# Patient Record
Sex: Male | Born: 1970 | Race: Black or African American | Hispanic: No | Marital: Married | Smoking: Never smoker
Health system: Southern US, Community
[De-identification: ages and names within clinical notes are randomized; demographics above are authoritative.]

## PROBLEM LIST (undated history)

## (undated) DIAGNOSIS — J3800 Paralysis of vocal cords and larynx, unspecified: Secondary | ICD-10-CM

## (undated) DIAGNOSIS — G44009 Cluster headache syndrome, unspecified, not intractable: Secondary | ICD-10-CM

## (undated) DIAGNOSIS — Z945 Skin transplant status: Secondary | ICD-10-CM

## (undated) DIAGNOSIS — S060X9A Concussion with loss of consciousness of unspecified duration, initial encounter: Secondary | ICD-10-CM

## (undated) DIAGNOSIS — G43909 Migraine, unspecified, not intractable, without status migrainosus: Secondary | ICD-10-CM

## (undated) DIAGNOSIS — Z902 Acquired absence of lung [part of]: Secondary | ICD-10-CM

## (undated) HISTORY — PX: LARYNX SURGERY: SHX692

## (undated) HISTORY — PX: OTHER SURGICAL HISTORY: SHX169

## (undated) HISTORY — DX: Cluster headache syndrome, unspecified, not intractable: G44.009

## (undated) HISTORY — DX: Concussion with loss of consciousness of unspecified duration, initial encounter: S06.0X9A

## (undated) HISTORY — DX: Acquired absence of lung (part of): Z90.2

---

## 1993-07-16 DIAGNOSIS — J3800 Paralysis of vocal cords and larynx, unspecified: Secondary | ICD-10-CM

## 1993-07-16 HISTORY — DX: Paralysis of vocal cords and larynx, unspecified: J38.00

## 2006-04-17 ENCOUNTER — Ambulatory Visit: Payer: Self-pay | Admitting: Internal Medicine

## 2010-06-01 ENCOUNTER — Encounter
Admission: RE | Admit: 2010-06-01 | Discharge: 2010-06-01 | Payer: Self-pay | Source: Home / Self Care | Admitting: Family Medicine
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
## 2013-05-26 ENCOUNTER — Ambulatory Visit
Admission: RE | Admit: 2013-05-26 | Discharge: 2013-05-26 | Disposition: A | Discharge status: Home / Self Care | Payer: 59 | Source: Ambulatory Visit | Attending: Family Medicine | Admitting: Family Medicine

## 2013-05-26 ENCOUNTER — Other Ambulatory Visit: Payer: Self-pay | Admitting: Family Medicine

## 2013-05-26 DIAGNOSIS — G8929 Other chronic pain: Secondary | ICD-10-CM

## 2013-09-16 ENCOUNTER — Emergency Department (HOSPITAL_COMMUNITY)
Admission: EM | Admit: 2013-09-16 | Discharge: 2013-09-17 | Disposition: A | Discharge status: Home / Self Care | Payer: Managed Care, Other (non HMO) | Attending: Emergency Medicine | Admitting: Emergency Medicine

## 2013-09-16 DIAGNOSIS — Z8709 Personal history of other diseases of the respiratory system: Secondary | ICD-10-CM | POA: Insufficient documentation

## 2013-09-16 DIAGNOSIS — G44009 Cluster headache syndrome, unspecified, not intractable: Secondary | ICD-10-CM

## 2013-09-16 HISTORY — DX: Skin transplant status: Z94.5

## 2013-09-16 HISTORY — DX: Paralysis of vocal cords and larynx, unspecified: J38.00

## 2013-09-17 ENCOUNTER — Encounter (HOSPITAL_COMMUNITY): Payer: Self-pay | Admitting: Emergency Medicine

## 2013-09-17 MED ORDER — DIPHENHYDRAMINE HCL 50 MG/ML IJ SOLN: 12.5000 mg | Freq: Once | INTRAMUSCULAR | Status: DC

## 2013-09-17 MED ORDER — DEXAMETHASONE SODIUM PHOSPHATE 10 MG/ML IJ SOLN: 10.0000 mg | Freq: Once | INTRAMUSCULAR | Status: DC

## 2013-09-17 MED ORDER — METOCLOPRAMIDE HCL 10 MG PO TABS: 10.0000 mg | ORAL_TABLET | Freq: Once | ORAL | Status: DC

## 2013-09-17 NOTE — Discharge Instructions (Signed)
Cluster Headache  Cluster headaches are recognized by their pattern of deep, intense head pain. They normally occur on one side of your head, but they may "switch sides" in subsequent episodes. Typically, cluster headaches:   · Are severe in nature.    · Occur repeatedly over weeks to months and are followed by periods of no headaches.    · Can last from 15 minutes to 3 hours.    · Occur at the same time each day, often at night.    · Occur several times a day.  CAUSES  The exact cause of cluster headaches is not known. Alcohol use may be associated with cluster headaches.  SIGNS AND SYMPTOMS   · Severe pain that begins in or around your eye or temple.    · One-sided head pain.    · Feeling sick to your stomach (nauseous).    · Sensitivity to light.    · Runny nose.    · Eye redness, tearing, and nasal stuffiness on the side of your head where you are experiencing pain.    · Sweaty, pale skin of the face.    · Droopy or swollen eyelid.    · Restlessness.  DIAGNOSIS   Cluster headaches are diagnosed based on symptoms and a physical exam. Your health care provider may order a CT scan or an MRI of your head or lab tests to see if your headaches are caused by other medical conditions.   TREATMENT   · Medicines for pain relief and to prevent recurrent attacks. Some people may need a combination of medicines.  · Oxygen for pain relief.    · Biofeedback programs to help reduce headache pain.    It may be helpful to keep a headache diary. This may help you find a trend for what is triggering your headaches. Your health care provider can develop a treatment plan.   HOME CARE INSTRUCTIONS   During cluster periods:   · Follow a regular sleep schedule. Do not vary the amount and time that you sleep from day to day. It is important to stay on the same schedule during a cluster period to help prevent headaches.    · Avoid alcohol.    · Stop smoking if you smoke.    SEEK MEDICAL CARE IF:  · You have any changes from your previous  cluster headaches either in intensity or frequency.    · You are not getting relief from medicines you are taking.    SEEK IMMEDIATE MEDICAL CARE IF:   · You faint.    · You have weakness or numbness, especially on one side of your body or face.    · You have double vision.    · You have nausea or vomiting that is not relieved within several hours.    · You cannot keep your balance or have difficulty talking or walking.    · You have neck pain or stiffness.    · You have a fever.  MAKE SURE YOU:  · Understand these instructions.    · Will watch your condition.    · Will get help right away if you are not doing well or get worse.  Document Released: 07/02/2005 Document Revised: 04/22/2013 Document Reviewed: 01/22/2013  ExitCare® Patient Information ©2014 [employer].

## 2013-09-17 NOTE — ED Notes (Signed)
Instructed by Nehemiah Settle, PA to hold meds at this time to see if O2 continues to help with pain.

## 2013-09-17 NOTE — ED Provider Notes (Signed)
CSN: [id number]     Arrival date & time 09/16/13  2246 History   First MD Initiated Contact with Patient 09/17/13 0200     Chief Complaint  Patient presents with  . Headache     (Consider location/radiation/quality/duration/timing/severity/associated sxs/prior Treatment) Patient is a 43 y.o. male presenting with headaches. The history is provided by the patient. No language interpreter was used.  Headache Pain location:  L parietal and L temporal Radiates to:  Face Associated symptoms: no fever   Associated symptoms comment:  He describes the headache as sharp, shooting pain from the left parietal and temporal scalp extending into left face. It is sudden and sharp, then lingers with significant but less intense pain for up to 3 hours at a time. He states it has kept him up for the past week. No nausea, fever, visual changes.    Past Medical History  Diagnosis Date  . Paralyzed vocal cords   . H/O skin graft    Past Surgical History  Procedure Laterality Date  . Total lung removal Left     1995   No family history on file. History  Substance Use Topics  . Smoking status: Not on file  . Smokeless tobacco: Not on file  . Alcohol Use: Not on file    Review of Systems  Constitutional: Negative for fever and chills.  Respiratory: Negative.   Cardiovascular: Negative.   Gastrointestinal: Negative.   Musculoskeletal: Negative.   Skin: Negative.   Neurological: Positive for headaches.      Allergies  Review of patient's allergies indicates no known allergies.  Home Medications   Current Outpatient Rx  Name  Route  Sig  Dispense  Refill  . Aspirin-Caffeine 845-65 MG PACK   Oral   Take 1 Package by mouth every 6 (six) hours as needed (pain).         Marland Kitchen HYDROcodone-acetaminophen (NORCO/VICODIN) 5-325 MG per tablet   Oral   Take 1 tablet by mouth every 6 (six) hours as needed for severe pain.          Marland Kitchen ibuprofen (ADVIL,MOTRIN) 200 MG tablet   Oral   Take 600 mg  by mouth every 6 (six) hours as needed.         . naproxen sodium (ANAPROX) 220 MG tablet   Oral   Take 440 mg by mouth 2 (two) times daily as needed.          BP 124/70  Pulse 62  Temp(Src) 97.8 F (36.6 C) (Oral)  Resp 20  Ht 5\' 4"  (1.626 m)  Wt 172 lb (78.019 kg)  BMI 29.51 kg/m2  SpO2 98% Physical Exam  Constitutional: He is oriented to person, place, and time. He appears well-developed and well-nourished.  HENT:  Head: Normocephalic and atraumatic.  Eyes: EOM are normal. Pupils are equal, round, and reactive to light.  Neck: Normal range of motion.  Cardiovascular: Normal rate and regular rhythm.   No murmur heard. Pulmonary/Chest: Effort normal and breath sounds normal. He has no wheezes. He has no rales.  Abdominal: Soft. There is no tenderness.  Neurological: He is alert and oriented to person, place, and time. He has normal strength and normal reflexes. No sensory deficit. He displays a negative Romberg sign.    ED Course  Procedures (including critical care time) Labs Review Labs Reviewed - No data to display Imaging Review No results found.   EKG Interpretation None      MDM   Final diagnoses:  None    1. Cluster headache  Patient's pain is 100% better with high flow oxygen supporting diagnosis of cluster type headache. He remains pain free on re-examination. Stable for discharge.     Dewaine Oats, PA-C 09/17/13 [phone]

## 2013-09-17 NOTE — ED Notes (Signed)
Pt states he has had a HA since Sunday and thought it was a tooth but went to dentist and ruled that out. Mostly L side. Pressure. Light sensitivity. Had headaches when he was younger but has started having them again in the past 6 months.

## 2013-09-17 NOTE — ED Notes (Signed)
Pt placed on NRB 10L to aid in helping with headache if it is indead a cluster headache per Murphy PA.

## 2013-09-17 NOTE — ED Provider Notes (Signed)
Medical screening examination/treatment/procedure(s) were performed by non-physician practitioner and as supervising physician I was immediately available for consultation/collaboration.    Shanley Furlough, MD 09/17/13 0604 

## 2013-09-19 ENCOUNTER — Encounter (HOSPITAL_COMMUNITY): Payer: Self-pay | Admitting: Emergency Medicine

## 2013-09-19 ENCOUNTER — Emergency Department (HOSPITAL_COMMUNITY)
Admission: EM | Admit: 2013-09-19 | Discharge: 2013-09-19 | Disposition: A | Discharge status: Home / Self Care | Payer: Managed Care, Other (non HMO) | Attending: Emergency Medicine | Admitting: Emergency Medicine

## 2013-09-19 DIAGNOSIS — H53149 Visual discomfort, unspecified: Secondary | ICD-10-CM | POA: Insufficient documentation

## 2013-09-19 DIAGNOSIS — R51 Headache: Secondary | ICD-10-CM | POA: Insufficient documentation

## 2013-09-19 DIAGNOSIS — Z8679 Personal history of other diseases of the circulatory system: Secondary | ICD-10-CM | POA: Insufficient documentation

## 2013-09-19 DIAGNOSIS — R519 Headache, unspecified: Secondary | ICD-10-CM

## 2013-09-19 DIAGNOSIS — Z902 Acquired absence of lung [part of]: Secondary | ICD-10-CM | POA: Insufficient documentation

## 2013-09-19 DIAGNOSIS — H538 Other visual disturbances: Secondary | ICD-10-CM | POA: Insufficient documentation

## 2013-09-19 DIAGNOSIS — H93239 Hyperacusis, unspecified ear: Secondary | ICD-10-CM | POA: Insufficient documentation

## 2013-09-19 HISTORY — DX: Migraine, unspecified, not intractable, without status migrainosus: G43.909

## 2013-09-19 MED ORDER — KETOROLAC TROMETHAMINE 60 MG/2ML IM SOLN
60.0000 mg | Freq: Once | INTRAMUSCULAR | Status: AC
  Administered 2013-09-19: 60 mg via INTRAMUSCULAR
  Filled 2013-09-19: qty 2

## 2013-09-19 MED ORDER — BUTALBITAL-APAP-CAFFEINE 50-325-40 MG PO TABS: 1.0000 | ORAL_TABLET | Freq: Four times a day (QID) | ORAL | Status: DC | PRN

## 2013-09-19 MED ORDER — PROCHLORPERAZINE EDISYLATE 5 MG/ML IJ SOLN
10.0000 mg | Freq: Once | INTRAMUSCULAR | Status: AC
  Administered 2013-09-19: 10 mg via INTRAMUSCULAR
  Filled 2013-09-19: qty 2

## 2013-09-19 NOTE — ED Provider Notes (Signed)
CSN: [id number]     Arrival date & time 09/19/13  1453 History  This chart was scribed for non-physician practitioner working with [place], DO by Stacy Gardner, ED scribe. This patient was seen in room [name] and the patient's care was started at 3:34 PM.   First MD Initiated Contact with Patient 09/19/13 1510     Chief Complaint  Patient presents with  . Headache     (Consider location/radiation/quality/duration/timing/severity/associated sxs/prior Treatment) Patient is a 43 y.o. male presenting with headaches. The history is provided by the patient and medical records. No language interpreter was used.  Headache Associated symptoms: photophobia   Associated symptoms: no nausea and no vomiting    HPI Comments: Richard Hughes is a 43 y.o. male who presents to the Emergency Department complaining of a constant, severe, headache with onset of 3 days ago and has been worse for the past four hours.  pain localized to left frontal and temporal regions with associated photophobia, phonophobia, and mildly blurred vision in his left eye which is common during his headaches. Denies dizziness, lightheadedness, confusion, changes in speech, tinnitus, or neck pain.  No fevers or chills.  He was seen at the ED three days ago for the same symptoms and was treated for clustered headaches.  Pt has a hx of migraines. Pt states migraines started with he was a teenager but is not currently on any maintenance medications.   No intervention tried today PTA.  Past Medical History  Diagnosis Date  . Paralyzed vocal cords   . H/O skin graft   . Migraine    Past Surgical History  Procedure Laterality Date  . Total lung removal Left     1995   No family history on file. History  Substance Use Topics  . Smoking status: Never Smoker   . Smokeless tobacco: Never Used  . Alcohol Use: Yes     Comment: Socailly     Review of Systems  Eyes: Positive for photophobia.  Gastrointestinal: Negative  for nausea and vomiting.  Neurological: Positive for headaches.  Psychiatric/Behavioral: Negative for confusion.  All other systems reviewed and are negative.      Allergies  Review of patient's allergies indicates no known allergies.  Home Medications   Current Outpatient Rx  Name  Route  Sig  Dispense  Refill  . Aspirin-Caffeine 845-65 MG PACK   Oral   Take 1 Package by mouth every 6 (six) hours as needed (pain).         Marland Kitchen HYDROcodone-acetaminophen (NORCO/VICODIN) 5-325 MG per tablet   Oral   Take 1 tablet by mouth every 6 (six) hours as needed for severe pain.          Marland Kitchen ibuprofen (ADVIL,MOTRIN) 200 MG tablet   Oral   Take 600 mg by mouth every 6 (six) hours as needed.         . naproxen sodium (ANAPROX) 220 MG tablet   Oral   Take 440 mg by mouth 2 (two) times daily as needed.          BP 142/82  Pulse 62  Temp(Src) 98.2 F (36.8 C) (Oral)  Resp 18  SpO2 98%  Physical Exam  Nursing note and vitals reviewed. Constitutional: He is oriented to person, place, and time. He appears well-developed and well-nourished. No distress.  HENT:  Head: Normocephalic and atraumatic.  Mouth/Throat: Oropharynx is clear and moist.  Eyes: Conjunctivae and EOM are normal. Pupils are equal, round, and reactive  to light.  Neck: Normal range of motion and full passive range of motion without pain. Neck supple. No rigidity.  Neck supple, no meningeal signs  Cardiovascular: Normal rate, regular rhythm and normal heart sounds.   Pulmonary/Chest: Effort normal and breath sounds normal.  Musculoskeletal: Normal range of motion.  Neurological: He is alert and oriented to person, place, and time. He has normal strength. He displays no tremor. No cranial nerve deficit or sensory deficit. He displays no seizure activity.  AAOx3, answering questions appropriately; equal strength UE and LE bilaterally; CN grossly intact; moves all extremities appropriately without ataxia; no focal neuro  deficits or facial asymmetry appreciated  Skin: Skin is warm and dry. He is not diaphoretic.  Psychiatric: He has a normal mood and affect.    ED Course  Procedures (including critical care time) DIAGNOSTIC STUDIES: Oxygen Saturation is 98% on room air, normal by my interpretation.    COORDINATION OF CARE:  3:37 PM Discussed course of care with pt . Pt understands and agrees.   Labs Review Labs Reviewed - No data to display Imaging Review No results found.   EKG Interpretation None      MDM   Final diagnoses:  Headache   Patient with typical migraine headache. No focal neuro deficits noted on physical exam. No fever or nuchal rigidity. At this time I have low suspicion for TIA, stroke, SAH, ICH, or meningitis.  Pt give migraine cocktail and will reassess.  Headache completely resolved after meds.  Pt states he feels better and is ready to go home.  Neuro exam remains intact.  Rx fioricet, only if no relief from excedrin migraine/tension headache.  Discussed plan with pt, they agreed.  Return precautions advised.  I personally performed the services described in this documentation, which was scribed in my presence. The recorded information has been reviewed and is accurate.  [name], PA-C 09/19/13 1835

## 2013-09-19 NOTE — ED Notes (Signed)
Pt reports having a headache that started at 11:00. Pt was seen here on 09/17/13 for the same. Pt has history of migraines. Pt endorses light and sound sensitivity. Pt denies nausea or emesis. Pt is A/O x4, vitals are WDL, and in NAD.

## 2013-09-19 NOTE — ED Provider Notes (Signed)
Medical screening examination/treatment/procedure(s) were performed by non-physician practitioner and as supervising physician I was immediately available for consultation/collaboration.   EKG Interpretation None        Richard Hughes N Jestine Bicknell, DO 09/19/13 2256 

## 2013-09-19 NOTE — Discharge Instructions (Signed)
Advised taking Excedrin Migraine or Excedrin tension headache.  If neither of these work, may take prescribed medication, only for SEVERE headache. Return to the ED for new or worsening symptoms.

## 2013-09-21 ENCOUNTER — Encounter: Payer: Self-pay | Admitting: Neurology

## 2013-09-21 ENCOUNTER — Telehealth: Payer: Self-pay | Admitting: Neurology

## 2013-09-21 ENCOUNTER — Ambulatory Visit (INDEPENDENT_AMBULATORY_CARE_PROVIDER_SITE_OTHER): Payer: Managed Care, Other (non HMO) | Admitting: Neurology

## 2013-09-21 VITALS — BP 127/55 | HR 65 | Ht 66.75 in | Wt 180.0 lb

## 2013-09-21 DIAGNOSIS — R51 Headache: Secondary | ICD-10-CM

## 2013-09-21 DIAGNOSIS — G44009 Cluster headache syndrome, unspecified, not intractable: Secondary | ICD-10-CM | POA: Insufficient documentation

## 2013-09-21 MED ORDER — METHYLPREDNISOLONE (PAK) 4 MG PO TABS: ORAL_TABLET | ORAL | Status: DC
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
## 2013-09-21 NOTE — Telephone Encounter (Signed)
Message sent to Dr. Janann Colonel, [facility] is needed to know what liter flow the patient should be on.  Please advise.  Thanks.

## 2013-09-21 NOTE — Telephone Encounter (Signed)
Please let her know it should be 88minutes at 12liters. Thanks.

## 2013-09-21 NOTE — Patient Instructions (Signed)
Overall you are doing fairly well but I do want to suggest a few things today:   Remember to drink plenty of fluid, eat healthy meals and do not skip any meals. Try to eat protein with a every meal and eat a healthy snack such as fruit or nuts in between meals. Try to keep a regular sleep-wake schedule and try to exercise daily, particularly in the form of walking, 20-30 minutes a day, if you can.   As far as your medications are concerned, I would like to suggest the following: 1)Please try using the steroid taper as instructed 2)We will place a request for oxygen therapy to help treat your headaches  As far as diagnostic testing:  1)I would like you to have a MRA of your brain. You will be called to schedule this  I will be in contact once the MRA is completed. Please call us with any interim questions, concerns, problems, updates or refill requests.   Please also call us for any test results so we can go over those with you on the phone.  My clinical assistant and will answer any of your questions and relay your messages to me and also relay most of my messages to you.   Our phone number is [phone]. We also have an after hours call service for urgent matters and there is a physician on-call for urgent questions. For any emergencies you know to call 911 or go to the nearest emergency room

## 2013-09-21 NOTE — Telephone Encounter (Signed)
[facility] was called and relayed the information but they stated that they need a written rx faxed over.  Please advise.

## 2013-09-21 NOTE — Telephone Encounter (Signed)
Prescription faxed over. Thanks

## 2013-09-21 NOTE — Telephone Encounter (Signed)
Grover from [facility] calling to state that they received order for oxygen for patient's pressure headaches but they need it to be written so that the liter flow is included in the order. Please call her back and advise.

## 2013-09-22 ENCOUNTER — Telehealth: Payer: Self-pay | Admitting: Neurology

## 2013-09-22 NOTE — Telephone Encounter (Signed)
Spoke with patient about his appointment that is scheduled for 09/24/13 @ 11:30 with Dr. Janann Colonel, patient received a call that he needed to come into the office for an appointment, the appointment is supposed to be with [facility], appointment with Janann Colonel has been cancelled. Patient also expressed some concerns with using the portable oxygen tank, Liane Comber is discussing the issue with him.

## 2013-09-22 NOTE — Telephone Encounter (Signed)
I called pt and he is to call [facility] to see about how to use 02 tank.  If he still has questions to call me and I'll be glad to see him when he is here for MRI.  (12 liters / 13min) prn cluster headache.

## 2013-09-24 ENCOUNTER — Other Ambulatory Visit: Payer: Self-pay | Admitting: Neurology

## 2013-09-24 ENCOUNTER — Encounter: Payer: Self-pay | Admitting: Neurology

## 2013-09-24 ENCOUNTER — Telehealth: Payer: Self-pay | Admitting: *Deleted

## 2013-09-24 ENCOUNTER — Ambulatory Visit: Payer: Managed Care, Other (non HMO) | Admitting: Neurology

## 2013-09-24 ENCOUNTER — Ambulatory Visit (INDEPENDENT_AMBULATORY_CARE_PROVIDER_SITE_OTHER): Payer: Managed Care, Other (non HMO)

## 2013-09-24 DIAGNOSIS — R51 Headache: Secondary | ICD-10-CM

## 2013-09-24 DIAGNOSIS — G44009 Cluster headache syndrome, unspecified, not intractable: Secondary | ICD-10-CM

## 2013-09-24 MED ORDER — VERAPAMIL HCL ER 240 MG PO CP24: 240.0000 mg | ORAL_CAPSULE | Freq: Every day | ORAL | Status: DC

## 2013-09-24 NOTE — Telephone Encounter (Signed)
Spoke with patient and he said the reason for the extension is that headaches are not better, medication /oxygen is not working, would like to try something else. He will need to  pick up letter before he goes to work on Sunday night.

## 2013-09-25 MED ORDER — GADOPENTETATE DIMEGLUMINE 469.01 MG/ML IV SOLN: 19.0000 mL | Freq: Once | INTRAVENOUS | Status: AC | PRN

## 2013-09-25 NOTE — Telephone Encounter (Signed)
Pt called to check on the status of this request.  Please call advise.  Thank you

## 2013-09-25 NOTE — Telephone Encounter (Signed)
Called patient to inform him that his letter that he requested was ready to be picked up at the front desk and if he has any other problems, questions or concerns to call the office. Patient verbalized understanding.

## 2013-09-28 NOTE — Progress Notes (Signed)
Quick Note:  Shared normal MR Brain results with patient per Dr Hazle Quant findings, he verbalized understanding ______

## 2014-02-18 NOTE — Telephone Encounter (Signed)
Noted  

## 2018-01-13 DIAGNOSIS — S060XAA Concussion with loss of consciousness status unknown, initial encounter: Secondary | ICD-10-CM

## 2018-01-13 DIAGNOSIS — S060X9A Concussion with loss of consciousness of unspecified duration, initial encounter: Secondary | ICD-10-CM

## 2018-01-13 HISTORY — DX: Concussion with loss of consciousness status unknown, initial encounter: S06.0XAA

## 2018-01-13 HISTORY — DX: Concussion with loss of consciousness of unspecified duration, initial encounter: S06.0X9A

## 2018-01-21 ENCOUNTER — Emergency Department (HOSPITAL_COMMUNITY): Payer: 59

## 2018-01-21 ENCOUNTER — Encounter (HOSPITAL_COMMUNITY): Payer: Self-pay | Admitting: Emergency Medicine

## 2018-01-21 ENCOUNTER — Other Ambulatory Visit: Payer: Self-pay

## 2018-01-21 ENCOUNTER — Emergency Department (HOSPITAL_COMMUNITY)
Admission: EM | Admit: 2018-01-21 | Discharge: 2018-01-21 | Disposition: A | Discharge status: Home / Self Care | Payer: 59 | Attending: Emergency Medicine | Admitting: Emergency Medicine

## 2018-01-21 DIAGNOSIS — R519 Headache, unspecified: Secondary | ICD-10-CM

## 2018-01-21 DIAGNOSIS — R55 Syncope and collapse: Secondary | ICD-10-CM | POA: Insufficient documentation

## 2018-01-21 DIAGNOSIS — R51 Headache: Secondary | ICD-10-CM | POA: Insufficient documentation

## 2018-01-21 LAB — BASIC METABOLIC PANEL
Anion gap: 11 (ref 5–15)
BUN: 16 mg/dL (ref 6–20)
CHLORIDE: 107 mmol/L (ref 98–111)
CO2: 27 mmol/L (ref 22–32)
CREATININE: 1.08 mg/dL (ref 0.61–1.24)
Calcium: 9.8 mg/dL (ref 8.9–10.3)
GFR calc Af Amer: >60 mL/min (ref >60)
GFR calc non Af Amer: >60 mL/min (ref >60)
Glucose, Bld: 99 mg/dL (ref 70–99)
Potassium: 4 mmol/L (ref 3.5–5.1)
Sodium: 145 mmol/L (ref 135–145)

## 2018-01-21 LAB — CBC
HCT: 44.2 % (ref 39.0–52.0)
Hemoglobin: 15.8 g/dL (ref 13.0–17.0)
MCH: 32.5 pg (ref 26.0–34.0)
MCHC: 35.7 g/dL (ref 30.0–36.0)
MCV: 90.9 fL (ref 78.0–100.0)
Platelets: 289 10*3/uL (ref 150–400)
RBC: 4.86 MIL/uL (ref 4.22–5.81)
RDW: 11.9 % (ref 11.5–15.5)
WBC: 6.1 10*3/uL (ref 4.0–10.5)

## 2018-01-21 LAB — URINALYSIS, ROUTINE W REFLEX MICROSCOPIC
Bilirubin Urine: NEGATIVE
GLUCOSE, UA: NEGATIVE mg/dL
HGB URINE DIPSTICK: NEGATIVE
Ketones, ur: NEGATIVE mg/dL
Leukocytes, UA: NEGATIVE
Nitrite: NEGATIVE
Protein, ur: NEGATIVE mg/dL
SPECIFIC GRAVITY, URINE: 1.023 (ref 1.005–1.030)
pH: 5 (ref 5.0–8.0)

## 2018-01-21 LAB — CBG MONITORING, ED: Glucose-Capillary: 92 mg/dL (ref 70–99)

## 2018-01-21 MED ORDER — KETOROLAC TROMETHAMINE 30 MG/ML IJ SOLN
30.0000 mg | Freq: Once | INTRAMUSCULAR | Status: AC
  Administered 2018-01-21: 30 mg via INTRAMUSCULAR
  Filled 2018-01-21: qty 1

## 2018-01-21 MED ORDER — IBUPROFEN 600 MG PO TABS: 600.0000 mg | ORAL_TABLET | Freq: Four times a day (QID) | ORAL | 0 refills | Status: DC | PRN

## 2018-01-21 MED ORDER — IOPAMIDOL (ISOVUE-370) INJECTION 76%
INTRAVENOUS | Status: AC
  Filled 2018-01-21: qty 100

## 2018-01-21 MED ORDER — IOPAMIDOL (ISOVUE-370) INJECTION 76%
100.0000 mL | Freq: Once | INTRAVENOUS | Status: AC | PRN
  Administered 2018-01-21: 100 mL via INTRAVENOUS

## 2018-01-21 MED ORDER — ACETAMINOPHEN 500 MG PO TABS: 500.0000 mg | ORAL_TABLET | Freq: Four times a day (QID) | ORAL | 0 refills | Status: DC | PRN

## 2018-01-21 MED ORDER — DEXAMETHASONE SODIUM PHOSPHATE 10 MG/ML IJ SOLN
10.0000 mg | Freq: Once | INTRAMUSCULAR | Status: AC
  Administered 2018-01-21: 10 mg via INTRAMUSCULAR
  Filled 2018-01-21: qty 1

## 2018-01-21 NOTE — ED Provider Notes (Signed)
[facility] DEPT Provider Note   CSN: [id number] Arrival date & time: 01/21/18  1623     History   Chief Complaint Chief Complaint  Patient presents with  . Headache  . Loss of Consciousness    HPI Richard Hughes is a 47 y.o. male with history of cluster headaches, migraines, and paralyzed vocal cords presents for evaluation of acute onset, waxing and waning left-sided headache for 3 days.  He states that on Sunday 3 days ago at around 11 AM he got up from a seated position and had a syncopal episode.  Patient's wife states that he had lost consciousness for approximately 1 minute before regaining consciousness.  She states that he fell backwards and landed on the back of his head.  She states that he was somewhat confused when he regained consciousness and was complaining of a left-sided headache at that time.  The patient was able to ambulate back to his seat and was at his mental status baseline within a few minutes.  There is no seizure-like activity, urinary incontinence, or tongue injury per the wife.  Patient denies any prodrome leading up to the syncopal episode but states that he was a little bit short of breath from laughing prior to the syncopal episode.  Since the episode, he has had a waxing and waning left-sided headache which radiates to the left side of the neck.  Headache worsens with position changes primarily with standing.  It is at times a dull pressure, sometimes sharp and stabbing.  States his headache is less severe than cluster headaches and migraines he has had in the past but has been more persistent.  He does note blurry vision and lightheadedness when he changes positions but denies any further syncopal episodes.  He does feel as though he has been having difficulty remembering things over the past few days and sometimes feels as though he is unsteady when he ambulates.  He has had some intermittent left foot and fingertip numbness since the  syncopal episode.  He denies back pain, chest pain, shortness of breath, abdominal pain, nausea, or vomiting.  He has tried ibuprofen without any significant relief of his symptoms.  The history is provided by the patient.    Past Medical History:  Diagnosis Date  . H/O skin graft   . Migraine   . Paralyzed vocal cords     Patient Active Problem List   Diagnosis Date Noted  . Headaches, cluster 09/21/2013    Past Surgical History:  Procedure Laterality Date  . total lung removal Left    1995        Home Medications    Prior to Admission medications   Medication Sig Start Date End Date Taking? Authorizing Provider  acetaminophen (TYLENOL) 500 MG tablet Take 1 tablet (500 mg total) by mouth every 6 (six) hours as needed. 01/21/18   Aloma Boch A, PA-C  ibuprofen (ADVIL,MOTRIN) 600 MG tablet Take 1 tablet (600 mg total) by mouth every 6 (six) hours as needed. 01/21/18   Nils Flack, Azarah Dacy A, PA-C  methylPREDNIsolone (MEDROL DOSPACK) 4 MG tablet follow package directions Patient not taking: Reported on 01/21/2018 09/21/13   Drema Dallas, DO  verapamil (VERELAN PM) 240 MG 24 hr capsule Take 1 capsule (240 mg total) by mouth at bedtime. Patient not taking: Reported on 01/21/2018 09/24/13   Drema Dallas, DO    Family History No family history on file.  Social History Social History   Tobacco  Use  . Smoking status: Never Smoker  . Smokeless tobacco: Never Used  Substance Use Topics  . Alcohol use: Yes    Comment: Socailly   . Drug use: No     Allergies   Fentanyl   Review of Systems Review of Systems  Constitutional: Negative for chills and fever.  Eyes: Positive for visual disturbance. Negative for photophobia.  Respiratory: Negative for shortness of breath.   Cardiovascular: Negative for chest pain.  Gastrointestinal: Negative for abdominal pain, nausea and vomiting.  Musculoskeletal: Positive for neck pain. Negative for back pain.  Neurological: Positive for syncope,  light-headedness, numbness (intermittent) and headaches. Negative for seizures and weakness.  All other systems reviewed and are negative.    Physical Exam Updated Vital Signs BP 132/90   Pulse (!) 58   Temp 97.9 F (36.6 C) (Oral)   Resp 16   Ht 5\' 5"  (1.651 m)   Wt 78.5 kg (173 lb)   SpO2 99%   BMI 28.79 kg/m   Physical Exam  Constitutional: He is oriented to person, place, and time. He appears well-developed and well-nourished. No distress.  HENT:  Head: Normocephalic and atraumatic.  No Battle's signs, no raccoon's eyes, no rhinorrhea. No hemotympanum. No tenderness to palpation of the face or skull. No deformity, crepitus, or swelling noted.   Eyes: Pupils are equal, round, and reactive to light. Conjunctivae and EOM are normal. Right eye exhibits no discharge. Left eye exhibits no discharge. Right eye exhibits normal extraocular motion and no nystagmus. Left eye exhibits normal extraocular motion and no nystagmus.  Neck: Normal range of motion. Neck supple. No JVD present. No neck rigidity. No tracheal deviation present. No Brudzinski's sign and no Kernig's sign noted.  No midline spine TTP, no paraspinal muscle tenderness, no deformity, crepitus, or step-off noted   Cardiovascular: Normal rate, regular rhythm, normal heart sounds and intact distal pulses.  Pulmonary/Chest: Effort normal and breath sounds normal.  Abdominal: Soft. Bowel sounds are normal. He exhibits no distension.  Musculoskeletal: Normal range of motion. He exhibits no edema.  5/5 strength of BUE and BLE major muscle groups.  Moving extremities spontaneously, no deformity, crepitus, ecchymosis, or swelling noted on palpation of the extremities. No midline spine TTP, no paraspinal muscle tenderness, no deformity, crepitus, or step-off noted   Neurological: He is alert and oriented to person, place, and time. He has normal strength. He is not disoriented. No cranial nerve deficit or sensory deficit. He  displays a negative Romberg sign. GCS eye subscore is 4. GCS verbal subscore is 5. GCS motor subscore is 6.  Mental Status:  Alert, thought content appropriate, able to give a coherent history. Speech fluent without evidence of aphasia. Able to follow 2 step commands without difficulty.  Cranial Nerves:  II:  Peripheral visual fields grossly normal, pupils equal, round, reactive to light III,IV, VI: ptosis not present, extra-ocular motions intact bilaterally  V,VII: smile symmetric, facial light touch sensation equal VIII: hearing grossly normal to voice  X: uvula elevates symmetrically  XI: bilateral shoulder shrug symmetric and strong XII: midline tongue extension without fassiculations Motor:  Normal tone. 5/5 strength of BUE and BLE major muscle groups including strong and equal grip strength and dorsiflexion/plantar flexion Sensory: light touch normal in all extremities. Cerebellar: normal finger-to-nose with bilateral upper extremities Gait: normal gait and balance. Able to walk on toes and heels with ease.     Skin: Skin is warm and dry. No erythema.  Psychiatric: He has a normal  mood and affect. His behavior is normal.  Nursing note and vitals reviewed.    ED Treatments / Results  Labs (all labs ordered are listed, but only abnormal results are displayed) Labs Reviewed  BASIC METABOLIC PANEL  CBC  URINALYSIS, ROUTINE W REFLEX MICROSCOPIC  CBG MONITORING, ED    EKG EKG Interpretation  Date/Time:  Tuesday January 21 2018 16:32:46 EDT Ventricular Rate:  70 PR Interval:    QRS Duration: 99 QT Interval:  371 QTC Calculation: 401 R Axis:   57 Text Interpretation:  Sinus rhythm Borderline T wave abnormalities No old tracing to compare Confirmed by Daleen Bo [phone]) on 01/21/2018 8:40:23 PM   Radiology Ct Angio Head W Or Wo Contrast  Result Date: 01/21/2018 CLINICAL DATA:  Witnessed syncopal episode three days ago, struck head on floor. Subsequent LEFT neck pain and  headache. History of migraines, vocal cord paralysis, LEFT pneumonectomy. EXAM: CT ANGIOGRAPHY HEAD TECHNIQUE: Multidetector CT imaging of the head was performed using the standard protocol during bolus administration of intravenous contrast. Multiplanar CT image reconstructions and MIPs were obtained to evaluate the vascular anatomy. CONTRAST:  180mL ISOVUE-370 IOPAMIDOL (ISOVUE-370) INJECTION 76% COMPARISON:  CT HEAD and CT cervical spine January 21, 2018. FINDINGS: ANTERIOR CIRCULATION: Patent cervical internal carotid arteries, petrous, cavernous and supra clinoid internal carotid arteries. Patent anterior communicating artery. Patent anterior and middle cerebral arteries. No large vessel occlusion, significant stenosis, contrast extravasation or aneurysm. POSTERIOR CIRCULATION: Patent vertebral arteries, vertebrobasilar junction and basilar artery, as well as main branch vessels. Patent posterior cerebral arteries. Small RIGHT posterior communicating artery present. No large vessel occlusion, significant stenosis, contrast extravasation or aneurysm. VENOUS SINUSES: Major dural venous sinuses are patent though not tailored for evaluation on this angiographic examination. ANATOMIC VARIANTS: None. DELAYED PHASE: No abnormal intracranial enhancement. MIP images reviewed. IMPRESSION: Normal CTA HEAD. Electronically Signed   By: Elon Alas M.D.   On: 01/21/2018 22:17   Ct Head Wo Contrast  Result Date: 01/21/2018 CLINICAL DATA:  Syncopal episode. Left-sided neck pain and headache. EXAM: CT HEAD WITHOUT CONTRAST CT CERVICAL SPINE WITHOUT CONTRAST TECHNIQUE: Multidetector CT imaging of the head and cervical spine was performed following the standard protocol without intravenous contrast. Multiplanar CT image reconstructions of the cervical spine were also generated. COMPARISON:  MRI of the cervical spine June 01, 2010 FINDINGS: CT HEAD FINDINGS Brain: No evidence of acute infarction, hemorrhage,  hydrocephalus, extra-axial collection or mass lesion/mass effect. Vascular: No hyperdense vessel or unexpected calcification. Skull: Normal. Negative for fracture or focal lesion. Sinuses/Orbits: No acute finding. Other: None. CT CERVICAL SPINE FINDINGS Alignment: Normal. Skull base and vertebrae: No acute fracture. No primary bone lesion or focal pathologic process. Soft tissues and spinal canal: No prevertebral fluid or swelling. No visible canal hematoma. Disc levels:  Mild multilevel degenerative disc disease. Upper chest: Changes of left pneumonectomy. Other: No other abnormalities. IMPRESSION: 1. No acute intracranial abnormalities. 2. No fracture or traumatic malalignment in the cervical spine. Mild multilevel degenerative disc disease. Electronically Signed   By: Dorise Bullion III M.D   On: 01/21/2018 20:08   Ct Cervical Spine Wo Contrast  Result Date: 01/21/2018 CLINICAL DATA:  Syncopal episode. Left-sided neck pain and headache. EXAM: CT HEAD WITHOUT CONTRAST CT CERVICAL SPINE WITHOUT CONTRAST TECHNIQUE: Multidetector CT imaging of the head and cervical spine was performed following the standard protocol without intravenous contrast. Multiplanar CT image reconstructions of the cervical spine were also generated. COMPARISON:  MRI of the cervical spine June 01, 2010 FINDINGS:  CT HEAD FINDINGS Brain: No evidence of acute infarction, hemorrhage, hydrocephalus, extra-axial collection or mass lesion/mass effect. Vascular: No hyperdense vessel or unexpected calcification. Skull: Normal. Negative for fracture or focal lesion. Sinuses/Orbits: No acute finding. Other: None. CT CERVICAL SPINE FINDINGS Alignment: Normal. Skull base and vertebrae: No acute fracture. No primary bone lesion or focal pathologic process. Soft tissues and spinal canal: No prevertebral fluid or swelling. No visible canal hematoma. Disc levels:  Mild multilevel degenerative disc disease. Upper chest: Changes of left pneumonectomy.  Other: No other abnormalities. IMPRESSION: 1. No acute intracranial abnormalities. 2. No fracture or traumatic malalignment in the cervical spine. Mild multilevel degenerative disc disease. Electronically Signed   By: Dorise Bullion III M.D   On: 01/21/2018 20:08    Procedures Procedures (including critical care time)  Medications Ordered in ED Medications  ketorolac (TORADOL) 30 MG/ML injection 30 mg (30 mg Intramuscular Given 01/21/18 2044)  dexamethasone (DECADRON) injection 10 mg (10 mg Intramuscular Given 01/21/18 2043)  iopamidol (ISOVUE-370) 76 % injection 100 mL (100 mLs Intravenous Contrast Given 01/21/18 2154)     Initial Impression / Assessment and Plan / ED Course  I have reviewed the triage vital signs and the nursing notes.  Pertinent labs & imaging results that were available during my care of the patient were reviewed by me and considered in my medical decision making (see chart for details).     Patient with left-sided headache after syncopal episode 3 days ago.  No prodrome leading up to the fall except possibly short of breath from laughing.  Sounds more vasovagal given that the patient lost consciousness after standing up quickly.  He is afebrile, borderline bradycardic but vital signs otherwise stable.  He is nontoxic in appearance.  No focal neurologic deficits.  No evidence of basilar skull fracture.   Lab work reviewed by me shows no acute abnormalities including anemia, leukocytosis, or metabolic derangements.  No UTI.  No hypoglycemia.  EKG shows normal sinus rhythm with no ischemic changes. Will obtain CT of the head and neck for further assessment.  CT shows no acute intracranial abnormalities, no evidence of skull fracture, SAH, ICH, or cervical spine injury.  Given sudden onset of the headache and persistent nature we will obtain CTA to rule out subarachnoid hemorrhage.  CTA shows no acute abnormalities.  The patient was given Toradol and Decadron in the ED with  improvement in his headache.  On reevaluation he is resting comfortably and states he would like to go home.  I think this is reasonable given reassuring work-up in the ED today.  He was given concussion precautions.  Recommend follow-up with PCP or Dr. Hulan Saas with the concussion clinic in [place].  Discussed strict ED return precautions.  Patient and patient's wife verbalized understanding of and agreement with plan and patient stable for discharge home at this time.  Final Clinical Impressions(s) / ED Diagnoses   Final diagnoses:  Syncope and collapse  Left-sided headache    ED Discharge Orders        Ordered    acetaminophen (TYLENOL) 500 MG tablet  Every 6 hours PRN     01/21/18 2234    ibuprofen (ADVIL,MOTRIN) 600 MG tablet  Every 6 hours PRN     01/21/18 2234       Renita Papa, PA-C 01/22/18 2238    Lacretia Leigh, MD 01/23/18 [phone]

## 2018-01-21 NOTE — ED Notes (Signed)
Patient transported to CT 

## 2018-01-21 NOTE — ED Notes (Signed)
Bed: OJ75 Expected date:  Expected time:  Means of arrival:  Comments: Fall, minor abrasions, dementia

## 2018-01-21 NOTE — Discharge Instructions (Signed)
Alternate 600 mg of ibuprofen and [phone] mg of Tylenol every 3 hours as needed for pain. Do not exceed 4000 mg of Tylenol daily.  Take ibuprofen with food to avoid upset stomach issues.  Drink plenty water and get plenty of rest.  Stay in a dimly lit room with minimal stimuli.  Decrease screen time by at least 50%.  Avoid contact sports until cleared by physician.  You can follow-up with your primary care physician or Dr. Hulan Saas at [facility] in their concussion clinic.   Return to the emergency department if any concerning signs or symptoms develop such as suddenly worsening headache, persistent vision changes, weakness of one side of the body, or passing out.

## 2018-01-21 NOTE — ED Triage Notes (Signed)
Patient reports witnessed syncopal episode on Sunday. Reports hitting head on floor. Reports left side neck pain and headache since that time. Denies N/V/D. Denies chest pain and SOB.

## 2018-01-24 ENCOUNTER — Encounter: Payer: Self-pay | Admitting: Family Medicine

## 2018-01-24 ENCOUNTER — Encounter (HOSPITAL_COMMUNITY): Payer: Self-pay | Admitting: Emergency Medicine

## 2018-01-27 ENCOUNTER — Telehealth: Payer: Self-pay

## 2018-01-27 NOTE — Telephone Encounter (Signed)
Spoke with patient's wife. Patient suffered a syncopal episode last Sunday.Him and his wife were sitting on the couch when he got up to go from a seated position and fainted. He hit the back of his head. Did go to ER on Tuesday due to increasing headaches. He has been having pressure in the back of his head and dizziness. He was having migraines but those have dissipated to headaches. He does have history of migraines. No history of concussion or syncopal episodes. Has not been back to work since episode. Recommended that patient refrain from physical activity and to limit screen time until tomorrow.

## 2018-01-27 NOTE — Progress Notes (Signed)
Subjective:   I, Richard Hughes, am serving as a scribe for Dr. Hulan Hughes.  Chief Complaint: Richard Hughes, DOB: [birth year], is a 47 y.o. male who presents for head injury sustained on 01-18-2018 when he suffered a syncopal episode. He and his wife were sitting on the couch when he got up to go from a seated position, walked to kitchen, and fainted. He hit the back of his head on the floor. Did go to ER on Tuesday due to increasing headaches. He has been having pressure in the back of his head and dizziness. He was having migraines but those have dissipated to headaches. He does have history of migraines. No history of concussion or syncopal episodes. Has not been back to work since episode. 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  CT scan that patient did have was independently visualized by me showing no significant bleed noted or any true traumatic injury.  Patient denies any new medications, denies any chest pain, shortness of breath or any recent illnesses.   Injury date : 01/18/2018 Visit #: 1  Previous imagine.   History of Present Illness:    Concussion Self-Reported Symptom Score Symptoms rated on a scale 1-6, in last 24 hours  Headache: 4  Nausea: 4  Vomiting: 0  Balance Difficulty: 3  Dizziness: 3  Fatigue: 6  Trouble Falling Asleep: 6  Sleep More Than Usual: 0  Sleep Less Than Usual:5  Daytime Drowsiness:5  Photophobia: 5  Phonophobia: 0  Irritability: 5  Sadness: 6  Nervousness: 6  Feeling More Emotional: 6  Numbness or Tingling: 6, back of head, fingers, toes  Feeling Slowed Down: 6  Feeling Mentally Foggy: 6  Difficulty Concentrating: 5  Difficulty Remembering: 5  Visual Problems:4    Total Symptom Score: 98   Review of Systems: Pertinent items are noted in HPI.  Review of History: Past Medical History: Past Medical History:  Diagnosis Date  . H/O skin graft   . Migraine   . Paralyzed vocal cords     Past Surgical History:  has  a past surgical history that includes total lung removal (Left). Family History: family history includes Cancer in his maternal grandmother; Heart attack in his mother; Heart disease in his father; Hyperlipidemia in his father; Hypertension in his father. no family history of autoimmune Social History:  reports that he has never smoked. He does not have any smokeless tobacco history on file. He reports that he drinks alcohol. He reports that he does not use drugs. Current Medications: has a current medication list which includes the following prescription(s): acetaminophen, ibuprofen, methylprednisolone, and verapamil. Allergies: is allergic to fentanyl.  Objective:    Physical Examination Vitals:   01/28/18 1237  BP: 132/88  Pulse: 70  SpO2: 95%   General: No apparent distress alert and oriented x3 mood and affect normal, dressed appropriately.  HEENT: Pupils equal, extraocular movements intact  Respiratory: Patient's speak in full sentences and does not appear short of breath  Cardiovascular: No lower extremity edema, non tender, no erythema  Skin: Warm dry intact with no signs of infection or rash on extremities or on axial skeleton.  Abdomen: Soft nontender  Neuro: Cranial nerves II through XII are intact, neurovascularly intact in all extremities with 2+ DTRs and 2+ pulses.  Lymph: No lymphadenopathy of posterior or anterior cervical chain or axillae bilaterally.  Gait normal with good balance and coordination.  MSK:  Non tender with full range of motion and good  stability and symmetric strength and tone of shoulders, elbows, wrist,  knee and ankles bilaterally.  Psychiatric: Oriented X3, intact recent and remote memory, judgement and insight, normal mood and affect  Concussion testing performed today:  I spent 41 minutes with patient discussing test and results including review of history and patient chart and  integration of patient data, interpretation of standardized test  results and clinical data, clinical decision making, treatment planning and report,and interactive feedback to the patient with all of patients questions answered.  Patient's testing coefficient shows that there is a possibility that patient did not try effectively.  Patient also had significant improvement in symptoms after the testing   Neurocognitive testing (ImPACT):   Post #1:    Verbal Memory Composite  82 (54%)   Visual Memory Composite  57 (16%)   Visual Motor Speed Composite  25.33 (4%)   Reaction Time Composite  1.12 (<1%)   Cognitive Efficiency Index  .05     Vestibular Screening:       Headache  Dizziness  Smooth Pursuits n y  H. Saccades n y  V. Saccades n y  H. VOR n y  V. VOR n y  Economist n y      Convergence:17 cm  n n     Assessment:    No diagnosis found.  Richard Hughes presents with the following concussion subtypes. [x] Cognitive    Plan:   Action/Discussion: Reviewed diagnosis, management options, expected outcomes, and the reasons for scheduled and emergent follow-up. Questions were adequately answered. Patient expressed verbal understanding and agreement with the following plan.       I was personally involved with the physical evaluation of and am in agreement with the assessment and treatment plan for this patient.  Greater than 50% of this encounter was spent in direct consultation with the patient in evaluation, counseling, and coordination of care. Duration of encounter: 60minutes.  After Visit Summary printed out and provided to patient as appropriate.

## 2018-01-28 ENCOUNTER — Ambulatory Visit (INDEPENDENT_AMBULATORY_CARE_PROVIDER_SITE_OTHER): Payer: 59 | Admitting: Family Medicine

## 2018-01-28 ENCOUNTER — Encounter: Payer: Self-pay | Admitting: Family Medicine

## 2018-01-28 DIAGNOSIS — S060X9A Concussion with loss of consciousness of unspecified duration, initial encounter: Secondary | ICD-10-CM | POA: Insufficient documentation

## 2018-01-28 DIAGNOSIS — S060X0A Concussion without loss of consciousness, initial encounter: Secondary | ICD-10-CM | POA: Diagnosis not present

## 2018-01-28 DIAGNOSIS — S060XAA Concussion with loss of consciousness status unknown, initial encounter: Secondary | ICD-10-CM | POA: Insufficient documentation

## 2018-01-28 NOTE — Patient Instructions (Signed)
Good to see you  Maybe a mild concussion.  Fish oil 3 grams daily  CoQ10 200mg  daily  Vitamin D 2000 IU daily will all help with your symptoms quickly  Lets get you back to work on Thursday for 4 hour shifts for 1 week.  See me again in 7-10 days to make sure you are doing well

## 2018-01-28 NOTE — Assessment & Plan Note (Signed)
Patient may have a mild concussion.  Discussed with patient in great length about over-the-counter medications, icing regimen, which activities to do which wants to avoid.  Chronic history of headaches.  Patient has been out of work but patient's testing showed that he did have improvement in symptoms when he started doing some cognitive thinking.  Will start patient back at part-time in the next 24 hours.  Discussed over-the-counter medications that can help with any inflammation if there is any true concussion still remaining.  Do not feel that advanced imaging is warranted at this time.  Follow-up with me again in 7 to 10 days

## 2018-01-29 ENCOUNTER — Ambulatory Visit (INDEPENDENT_AMBULATORY_CARE_PROVIDER_SITE_OTHER): Payer: 59 | Admitting: Medical

## 2018-01-29 VITALS — Temp 98.2°F | Resp 16 | Ht 67.0 in | Wt 177.8 lb

## 2018-01-29 DIAGNOSIS — R55 Syncope and collapse: Secondary | ICD-10-CM | POA: Diagnosis not present

## 2018-01-29 DIAGNOSIS — S060X0A Concussion without loss of consciousness, initial encounter: Secondary | ICD-10-CM | POA: Diagnosis not present

## 2018-01-29 NOTE — Progress Notes (Signed)
Subjective: Chief Complaint  Patient presents with  . Follow-up    follow up ER concussion   Here for establish care visit.    Was seeing Dr. Darlyne Russian prior for PCP.  He reports being in good state of health and on 01/21/2018 was sitting on his couch laughing at show really hard, went to get up from a seated position and was within seconds felt dizzy, passed out and apparently hit the back of his head.  He ended up going to the emergency department a few days later after having headaches.  He has a history of headaches back in 2015 was seeing neurology now for a while but then the headaches went away once he had some dental care taking care of.  After the initial evaluation in the emergency department, including head scan and labs, and EKG, was referred to concussion clinic where he saw Dr. Gardenia Phlegm.  He was given concussion evaluation and recommendations, was advised part-time schedule at work for the next week and has follow-up planned with Dr. Tamala Julian  Today here to establish care and to discuss the syncope event.  Denies personal history of heart problems, he does have a history of trauma in the past with a chest wound suddenly requiring the removal of his left lung.  Recently he denies chest pain, shortness of breath, edema, palpitations, numbness, tingling, weakness, vision changes or other paresthesias.  He has had some headaches since the concussion, but no additional syncope episodes.  No prior similar syncope episodes.  The day he passed out he did feel dizzy and weak when he had gotten up.  Otherwise have been usual state of health without concerns.  He does have some family history of heart disease.  His sister had open heart surgery vessel bypass in her 6s, and he had subsequent cardiology evaluation at baseline a few years ago that was reportedly normal  Past Medical History:  Diagnosis Date  . H/O skin graft   . Migraine   . Paralyzed vocal cords    Current Outpatient  Medications on File Prior to Visit  Medication Sig Dispense Refill  . acetaminophen (TYLENOL) 500 MG tablet Take 1 tablet (500 mg total) by mouth every 6 (six) hours as needed. 30 tablet 0  . ibuprofen (ADVIL,MOTRIN) 600 MG tablet Take 1 tablet (600 mg total) by mouth every 6 (six) hours as needed. 30 tablet 0   No current facility-administered medications on file prior to visit.    ROS as in subjective   Objective: Temp 98.2 F (36.8 C) (Oral)   Resp 16   Ht 5\' 7"  (1.702 m)   Wt 177 lb 12.8 oz (80.6 kg)   SpO2 98%   BMI 27.85 kg/m   General appearance: alert, no distress, WD/WN, AA male  HEENT: normocephalic, sclerae anicteric, TMs pearly, nares patent, no discharge or erythema, pharynx normal Oral cavity: MMM, no lesions Neck: anterior surgical scar from prior  tracheotomy, supple, no lymphadenopathy, no thyromegaly, no masses, no bruits Heart: RRR, normal S1, S2, no murmurs Lungs: decreased left lung breath sounds, otherwise CTA bilaterally, no wheezes, rhonchi, or rales  Ext: no edema Pulses: 2+ symmetric, upper and lower extremities, normal cap refill Neuro: cn2-12 intact, nonfocal exam, no cerebellar signs    Assessment: Encounter Diagnoses  Name Primary?  . Syncope, unspecified syncope type Yes  . Concussion without loss of consciousness, initial encounter      Plan: Discussed symptoms and concerns.  I reviewed the 01/21/2018 emergency  department visit notes, labs, CT head and neck CT angio, reviewed EKG.  Orthostatics done today, normal  I suspect between the laughing spell getting up fast from a seated position probably had an vasovagal event.  He will follow-up with Dr. Tamala Julian in the concussion clinic.  Discussed importance of good hydration, particularly outside.  Discussed symptoms of vasovagal syncope.  Nothing suggest other cardiac issues or seizure or other worrisome cause.  Advised that if any additional syncopal episode or similar event, to come in or get  reevaluated right away.  Otherwise follow-up as planned with concussion clinic.   Genie was seen today for follow-up.  Diagnoses and all orders for this visit:  Syncope, unspecified syncope type  Concussion without loss of consciousness, initial encounter

## 2018-02-03 NOTE — Progress Notes (Signed)
Subjective:   I, Richard Hughes, am serving as a scribe for Dr. Hulan Saas.  Chief Complaint: Richard Hughes, DOB: Jul 14, 1971, is a 47 y.o. male who presents for head injury. He has been doing better than visit. Patient denies having headaches. Last headache was on Saturday. At work he is able to concentrate and does not develop headaches. Patient notes that he still "feels pressure" over his head where he fell and hit his head on posterior aspect.  Overall though feeling much better.  Injury date : 01/18/2018 Visit #: 2  Previous imagine.   History of Present Illness:    Concussion Self-Reported Symptom Score Symptoms rated on a scale 1-6, in last 24 hours All symptoms are at zero today.   Total symptom score: 0 Previous Symptom Score: 98  Review of Systems: Pertinent items are noted in HPI.  Review of History: Past Medical History: Past Medical History:  Diagnosis Date  . H/O skin graft   . Migraine   . Paralyzed vocal cords     Past Surgical History:  has a past surgical history that includes total lung removal (Left). Family History: family history includes Cancer in his maternal grandmother; Heart attack in his mother; Heart disease in his father; Hyperlipidemia in his father; Hypertension in his father. no family history of autoimmune Social History:  reports that he has never smoked. He does not have any smokeless tobacco history on file. He reports that he drinks alcohol. He reports that he does not use drugs. Current Medications: has a current medication list which includes the following prescription(s): acetaminophen and ibuprofen. Allergies: is allergic to fentanyl.  Objective:    Physical Examination Vitals:   02/04/18 1002  BP: 118/82  Pulse: 61  SpO2: 97%   General: No apparent distress alert and oriented x3 mood and affect normal, dressed appropriately.  HEENT: Pupils equal, extraocular movements intact  Respiratory: Patient's speak in full sentences and  does not appear short of breath  Cardiovascular: No lower extremity edema, non tender, no erythema  Skin: Warm dry intact with no signs of infection or rash on extremities or on axial skeleton.  Abdomen: Soft nontender  Neuro: Cranial nerves II through XII are intact, neurovascularly intact in all extremities with 2+ DTRs and 2+ pulses.  Lymph: No lymphadenopathy of posterior or anterior cervical chain or axillae bilaterally.  Gait normal with good balance and coordination.  MSK:  Non tender with full range of motion and good stability and symmetric strength and tone of shoulders, elbows, wrist,  knee and ankles bilaterally.  Psychiatric: Oriented X3, intact recent and remote memory, judgement and insight, normal mood and affect      Assessment:     Richard Hughes presents with the following concussion subtypes. Resolved   Plan:   Action/Discussion: Reviewed diagnosis, management options, expected outcomes, and the reasons for scheduled and emergent follow-up. Questions were adequately answered. Patient expressed verbal understanding and agreement with the following plan.

## 2018-02-04 ENCOUNTER — Ambulatory Visit (INDEPENDENT_AMBULATORY_CARE_PROVIDER_SITE_OTHER): Payer: 59 | Admitting: Family Medicine

## 2018-02-04 DIAGNOSIS — S060X0D Concussion without loss of consciousness, subsequent encounter: Secondary | ICD-10-CM

## 2018-02-04 NOTE — Patient Instructions (Signed)
Good to see you  You are good to go  Worsening symptoms give me a call  See me when you need me

## 2018-02-04 NOTE — Assessment & Plan Note (Signed)
Resolved, clear to return to work full time.  RTC as needed

## 2018-04-02 ENCOUNTER — Encounter: Payer: Self-pay | Admitting: Medical

## 2018-04-02 ENCOUNTER — Ambulatory Visit (INDEPENDENT_AMBULATORY_CARE_PROVIDER_SITE_OTHER): Payer: 59 | Admitting: Medical

## 2018-04-02 ENCOUNTER — Ambulatory Visit
Admission: RE | Admit: 2018-04-02 | Discharge: 2018-04-02 | Disposition: A | Discharge status: Home / Self Care | Payer: 59 | Source: Ambulatory Visit | Attending: Medical | Admitting: Medical

## 2018-04-02 VITALS — BP 124/76 | HR 73 | Temp 97.7°F | Resp 16 | Ht 65.0 in | Wt 179.0 lb

## 2018-04-02 DIAGNOSIS — Z87828 Personal history of other (healed) physical injury and trauma: Secondary | ICD-10-CM | POA: Insufficient documentation

## 2018-04-02 DIAGNOSIS — Q318 Other congenital malformations of larynx: Secondary | ICD-10-CM

## 2018-04-02 DIAGNOSIS — G44029 Chronic cluster headache, not intractable: Secondary | ICD-10-CM

## 2018-04-02 DIAGNOSIS — Z9889 Other specified postprocedural states: Secondary | ICD-10-CM

## 2018-04-02 DIAGNOSIS — R490 Dysphonia: Secondary | ICD-10-CM | POA: Diagnosis not present

## 2018-04-02 DIAGNOSIS — R0789 Other chest pain: Secondary | ICD-10-CM

## 2018-04-02 MED ORDER — BUTALBITAL-APAP-CAFFEINE 50-325-40 MG PO TABS: 1.0000 | ORAL_TABLET | Freq: Four times a day (QID) | ORAL | 1 refills | Status: DC | PRN

## 2018-04-02 MED ORDER — VERAPAMIL HCL ER 240 MG PO CP24: 240.0000 mg | ORAL_CAPSULE | Freq: Every day | ORAL | 2 refills | Status: DC

## 2018-04-02 MED ORDER — BUTALBITAL-APAP-CAFFEINE 50-325-40 MG PO TABS: 1.0000 | ORAL_TABLET | Freq: Four times a day (QID) | ORAL | 1 refills | Status: AC | PRN

## 2018-04-02 MED ORDER — ONDANSETRON HCL 4 MG PO TABS: 4.0000 mg | ORAL_TABLET | Freq: Three times a day (TID) | ORAL | 1 refills | Status: DC | PRN

## 2018-04-02 NOTE — Addendum Note (Signed)
Addended by: Carlena Hurl on: 04/02/2018 03:04 PM   Modules accepted: Orders

## 2018-04-02 NOTE — Progress Notes (Signed)
Subjective Chief Complaint  Patient presents with  . headache    headaches, sob X Friday cough drainage   Here for "severe headaches," SOB.    He notes week hx/o headaches on and off.   After headache, head feels sore for a while.  Had similar issues few years ago, saw neurology, was given medication.   Was prescribed oxygen for the headaches for a period of time.  Lately breathing is a problem.  Saw neurology few years ago for cluster headaches, was on verapamil for 2 months.  Nonsmoker.  Has hx/o left lung removed 1995 due to work accident, pressure wound to back, ribs punctured lung.   No hx/o asthma.    When he get tired, has bone that seems to protrude with deep breaths, hurts to take deep breaths  Has hx/o teflon that was used to repair vocal cords.  Feels hoarse and that something has moved in the throat.    No recent trauma or injury since last visit.  No syncope since last visit  Is planning to have FMLA completed  Past Medical History:  Diagnosis Date  . H/O skin graft   . Migraine   . Paralyzed vocal cords    Current Outpatient Medications on File Prior to Visit  Medication Sig Dispense Refill  . acetaminophen (TYLENOL) 500 MG tablet Take 1 tablet (500 mg total) by mouth every 6 (six) hours as needed. 30 tablet 0  . ibuprofen (ADVIL,MOTRIN) 600 MG tablet Take 1 tablet (600 mg total) by mouth every 6 (six) hours as needed. 30 tablet 0   No current facility-administered medications on file prior to visit.    Past Surgical History:  Procedure Laterality Date  . total lung removal Left    1995   As in subjective   Objective: BP 124/76   Pulse 73   Temp 97.7 F (36.5 C) (Oral)   Resp 16   Ht 5\' 5"  (1.651 m)   Wt 179 lb (81.2 kg)   SpO2 98%   BMI 29.79 kg/m    Wt Readings from Last 3 Encounters:  04/02/18 179 lb (81.2 kg)  02/04/18 177 lb (80.3 kg)  01/29/18 177 lb 12.8 oz (80.6 kg)     General appearance: alert, no distress, WD/WN, AA male, somewhat  hoarse HEENT: normocephalic, sclerae anicteric, PERRLA, EOMi, nares patent, no discharge or erythema, pharynx normal Oral cavity: MMM, no lesions Neck: supple, no lymphadenopathy, tender anterior larynx, no thyromegaly, no masses Heart: RRR, normal S1, S2, no murmurs Lungs: decreased left lung sounds, otherwise clear, no wheezes, rhonchi, or rales Chest wall - left sternal bony deformity around 3rd rib costochondral area that is tender, seems to have some pain with deep breathing in same area, large surgical scar left lateral and posterior chest wall, skin graft scar upper back Abdomen: +bs, soft, non tender, non distended, no masses, no hepatomegaly, no splenomegaly Extremities: no edema, no cyanosis, no clubbing Pulses: 2+ symmetric, upper and lower extremities, normal cap refill Neurological: alert, oriented x 3, CN2-12 intact, strength normal upper extremities and lower extremities, sensation normal throughout, DTRs 2+ throughout, no cerebellar signs, gait normal Psychiatric: normal affect, behavior normal, pleasant     Assessment: Encounter Diagnoses  Name Primary?  . Chronic cluster headache, not intractable Yes  . Chest wall pain   . Hoarseness   . History of lung surgery   . History of trauma of chest   . Vocal cord anomaly     Plan: We discussed  his symptoms and exam findings.  His shortness of breath really seems more related to localized chest wall pain.  There is a step-off deformities that he says is chronic from his injury years ago, and this seems to be causing some localized pain with deep inspiration and expiration.  We will send for chest x-ray and rib x-ray to further evaluate  I reviewed back to his history him back in 2015 he was seen by neurology for cluster headaches.  Begin back on verapamil for prevention, can use Fioricet as needed, Zofran as needed, referral back to neurology  Hoarseness, history of vocal cord trauma- may need to go back and see your nose and  throat.  Follow-up pending x-ray  Chistian was seen today for headache.  Diagnoses and all orders for this visit:  Chronic cluster headache, not intractable -     Ambulatory referral to Neurology  Chest wall pain -     DG Chest 2 View; Future -     DG Ribs Unilateral Left; Future -     Sedimentation rate  Hoarseness -     DG Chest 2 View; Future -     Sedimentation rate  History of lung surgery -     DG Chest 2 View; Future -     DG Ribs Unilateral Left; Future -     Sedimentation rate  History of trauma of chest -     DG Chest 2 View; Future -     DG Ribs Unilateral Left; Future -     Sedimentation rate  Vocal cord anomaly  Other orders -     verapamil (VERELAN PM) 240 MG 24 hr capsule; Take 1 capsule (240 mg total) by mouth at bedtime. -     butalbital-acetaminophen-caffeine (FIORICET) 50-325-40 MG tablet; Take 1 tablet by mouth every 6 (six) hours as needed for headache. -     ondansetron (ZOFRAN) 4 MG tablet; Take 1 tablet (4 mg total) by mouth every 8 (eight) hours as needed for nausea or vomiting.

## 2018-04-02 NOTE — Patient Instructions (Addendum)
Recommendations  Begin back on verapamil once daily for headache prevention  You can use Fioricet as needed for acute headache, Zofran as needed for nausea  We are referring you back to neurology for other treatment recommendations for cluster headaches  Go for x-ray of ribs and chest to make sure no unusual findings  Pending x-ray I may need to have you see a specialist if there is something going on with the bony deformity  We may need to consider follow-up with your nose and throat as well about the first issue hoarseness in history of throat deformity

## 2018-04-03 LAB — SEDIMENTATION RATE: Sed Rate: 5 mm/hr (ref 0–15)

## 2018-04-08 ENCOUNTER — Encounter: Payer: Self-pay | Admitting: Diagnostic Neuroimaging

## 2018-04-08 ENCOUNTER — Ambulatory Visit (INDEPENDENT_AMBULATORY_CARE_PROVIDER_SITE_OTHER): Payer: 59 | Admitting: Diagnostic Neuroimaging

## 2018-04-08 ENCOUNTER — Encounter: Payer: Self-pay | Admitting: *Deleted

## 2018-04-08 VITALS — BP 116/74 | HR 72 | Ht 65.0 in | Wt 179.2 lb

## 2018-04-08 DIAGNOSIS — G44019 Episodic cluster headache, not intractable: Secondary | ICD-10-CM | POA: Diagnosis not present

## 2018-04-08 MED ORDER — SUMATRIPTAN SUCCINATE 6 MG/0.5ML ~~LOC~~ SOLN: 6.0000 mg | SUBCUTANEOUS | 3 refills | Status: DC | PRN

## 2018-04-08 NOTE — Patient Instructions (Signed)
  CLUSTER HEADACHES - continue verapamil 240mg  at bedtime - sumatriptan injection as needed - home oxygen as needed for cluster headaches - may return to work 04/14/18

## 2018-04-08 NOTE — Progress Notes (Signed)
[facility]  PATIENT: Richard Hughes DOB: Mar 03, 1971  REFERRING CLINICIAN: Tysinger HISTORY FROM: patient  REASON FOR VISIT: new consult    HISTORICAL  CHIEF COMPLAINT:  Chief Complaint  Patient presents with  . New Patient (Initial Visit)    Rm 6, alone.  . Chronic Cluster Headaches.    Chana Bode, PA. Seen by Dr. Emelda Brothers 2015. Taking verapamil, zofran, fiorcet pe pcp.  Trigger unknown. Started about one month ago, on and off intense 03-29-18. L side head pain, when pain resides, still with sosreness. FMLA since 03-31-18 to pcp    HISTORY OF PRESENT ILLNESS:   47 year old male here for evaluation of cluster headaches.  Patient has had these headaches since high school with left-sided, periorbital, throbbing severe pain associated with soreness over the scalp, lacrimation, scleral injection, photophobia, nausea, blurred vision and seeing sparkles.  Patient was patient diagnosed with cluster headaches and tried on verapamil in the past around 2015.  Also was tried on home oxygen which seemed to help.  Headaches resolved spontaneously and patient had no further follow-up in neurology.  Since that time headaches have returned in August 2019.  Now having headaches lasting 1 to 3 hours at a time.  Headaches can occur several times per day and in clusters lasting 3 to 6 weeks at a time.  In the past he has had periods of remission lasting up to 2 to 3 years at a time.  In July 2019 patient was laughing, all of a sudden got lightheaded and passed out.  He has had had a concussion.  Those symptoms have resolved.  Patient was involved in the emergency room.   REVIEW OF SYSTEMS: Full 14 system review of systems performed and negative with exception of: Blurred vision eye pain memory loss headache weight gain not enough sleep sleepiness dizziness.  History of work-related trauma with rib fracture, pneumothorax, requiring left lung resection  surgery.  ALLERGIES: Allergies  Allergen Reactions  . Fentanyl Hives    HOME MEDICATIONS: Outpatient Medications Prior to Visit  Medication Sig Dispense Refill  . acetaminophen (TYLENOL) 500 MG tablet Take 1 tablet (500 mg total) by mouth every 6 (six) hours as needed. 30 tablet 0  . butalbital-acetaminophen-caffeine (FIORICET) 50-325-40 MG tablet Take 1 tablet by mouth every 6 (six) hours as needed for headache. 10 tablet 1  . ibuprofen (ADVIL,MOTRIN) 600 MG tablet Take 1 tablet (600 mg total) by mouth every 6 (six) hours as needed. 30 tablet 0  . ondansetron (ZOFRAN) 4 MG tablet Take 1 tablet (4 mg total) by mouth every 8 (eight) hours as needed for nausea or vomiting. 20 tablet 1  . verapamil (VERELAN PM) 240 MG 24 hr capsule Take 1 capsule (240 mg total) by mouth at bedtime. 30 capsule 2   No facility-administered medications prior to visit.     PAST MEDICAL HISTORY: Past Medical History:  Diagnosis Date  . Concussion 01/2018   after fainting.  . H/O skin graft   . Migraine   . Paralyzed vocal cords     PAST SURGICAL HISTORY: Past Surgical History:  Procedure Laterality Date  . total lung removal Left    1995    FAMILY HISTORY: Family History  Problem Relation Age of Onset  . Heart attack Mother   . Heart disease Father   . Hyperlipidemia Father   . Hypertension Father   . Cancer Maternal Grandmother     SOCIAL HISTORY: Social History   Socioeconomic History  .  Marital status: Married    Spouse name: Richard Hughes  . Number of children: 3  . Years of education: college  . Highest education level: Not on file  Occupational History    Employer: [employer]  . Financial resource strain: Not on file  . Food insecurity:    Worry: Not on file    Inability: Not on file  . Transportation needs:    Medical: Not on file    Non-medical: Not on file  Tobacco Use  . Smoking status: Never Smoker  . Smokeless tobacco: Never Used  Substance and  Sexual Activity  . Alcohol use: Yes    Comment: Socailly   . Drug use: No  . Sexual activity: Not on file  Lifestyle  . Physical activity:    Days per week: Not on file    Minutes per session: Not on file  . Stress: Not on file  Relationships  . Social connections:    Talks on phone: Not on file    Gets together: Not on file    Attends religious service: Not on file    Active member of club or organization: Not on file    Attends meetings of clubs or organizations: Not on file    Relationship status: Not on file  . Intimate partner violence:    Fear of current or ex partner: Not on file    Emotionally abused: Not on file    Physically abused: Not on file    Forced sexual activity: Not on file  Other Topics Concern  . Not on file  Social History Narrative   ** Merged History Encounter **       Patient is married Richard Hughes ), has 3 children   Patient is right handed   Education level is college   Caffeine consumption is 0     PHYSICAL EXAM  GENERAL EXAM/CONSTITUTIONAL: Vitals:  Vitals:   04/08/18 1311  BP: 116/74  Pulse: 72  Weight: 179 lb 3.2 oz (81.3 kg)  Height: 5\' 5"  (1.651 m)     Body mass index is 29.82 kg/m. Wt Readings from Last 3 Encounters:  04/08/18 179 lb 3.2 oz (81.3 kg)  04/02/18 179 lb (81.2 kg)  02/04/18 177 lb (80.3 kg)     Patient is in no distress; well developed, nourished and groomed; neck is supple  CARDIOVASCULAR:  Examination of carotid arteries is normal; no carotid bruits  Regular rate and rhythm, no murmurs  Examination of peripheral vascular system by observation and palpation is normal  EYES:  Ophthalmoscopic exam of optic discs and posterior segments is normal; no papilledema or hemorrhages  Visual Acuity Screening   Right eye Left eye Both eyes  Without correction: 20/30 20/30   With correction:        MUSCULOSKELETAL:  Gait, strength, tone, movements noted in Neurologic exam below  NEUROLOGIC: MENTAL  STATUS:  No flowsheet data found.  awake, alert, oriented to person, place and time  recent and remote memory intact  normal attention and concentration  language fluent, comprehension intact, naming intact  fund of knowledge appropriate  CRANIAL NERVE:   2nd - no papilledema on fundoscopic exam  2nd, 3rd, 4th, 6th - pupils equal and reactive to light, visual fields full to confrontation, extraocular muscles intact, no nystagmus  5th - facial sensation symmetric  7th - facial strength symmetric  8th - hearing intact  9th - palate elevates symmetrically, uvula midline  11th - shoulder shrug symmetric  12th - tongue protrusion midline  MOTOR:   normal bulk and tone, full strength in the BUE, BLE  SENSORY:   normal and symmetric to light touch, temperature, vibration  COORDINATION:   finger-nose-finger, fine finger movements normal  REFLEXES:   deep tendon reflexes present and symmetric  GAIT/STATION:   narrow based gait; able to walk tandem     DIAGNOSTIC DATA (LABS, IMAGING, TESTING) - I reviewed patient records, labs, notes, testing and imaging myself where available.  Lab Results  Component Value Date   WBC 6.1 01/21/2018   HGB 15.8 01/21/2018   HCT 44.2 01/21/2018   MCV 90.9 01/21/2018   PLT 289 01/21/2018      Component Value Date/Time   NA 145 01/21/2018 1704   K 4.0 01/21/2018 1704   CL 107 01/21/2018 1704   CO2 27 01/21/2018 1704   GLUCOSE 99 01/21/2018 1704   BUN 16 01/21/2018 1704   CREATININE 1.08 01/21/2018 1704   CALCIUM 9.8 01/21/2018 1704   GFRNONAA >60 01/21/2018 1704   GFRAA >60 01/21/2018 1704   No results found for: CHOL, HDL, LDLCALC, LDLDIRECT, TRIG, CHOLHDL No results found for: HGBA1C No results found for: VITAMINB12 No results found for: TSH   09/24/13 MRI brain  - normal  09/24/13 MRA head  - normal  01/21/18 CTA head  - Normal CTA HEAD.    ASSESSMENT AND PLAN  47 y.o. year old male here with episodic  cluster headaches.  We will proceed with treatment options.   Dx:  1. Episodic cluster headache, not intractable     PLAN:  CLUSTER HEADACHES - continue verapamil 240mg  at bedtime - sumatriptan injection as needed - home oxygen as needed (15 minutes of 12L/min via NRB) - may return to work 04/14/18  Orders Placed This Encounter  Procedures  . DME Other see comment   Meds ordered this encounter  Medications  . SUMAtriptan (IMITREX) 6 MG/0.5ML SOLN injection    Sig: Inject 0.5 mLs (6 mg total) into the skin every 2 (two) hours as needed for up to 2 doses for migraine or headache.    Dispense:  6 vial    Refill:  3   Return in about 6 months (around 10/07/2018).    Penni Bombard, MD 0/03/2329, 0:76 PM Certified in Neurology, Neurophysiology and Neuroimaging  [facility] [address], [place], [place] [address] [phone]

## 2018-04-10 ENCOUNTER — Telehealth: Payer: Self-pay | Admitting: *Deleted

## 2018-04-10 DIAGNOSIS — G44019 Episodic cluster headache, not intractable: Secondary | ICD-10-CM

## 2018-04-10 NOTE — Telephone Encounter (Signed)
Angie from [facility] called: Needing clarification on orders for home.  Is this to be portable oxygen?  NRB (nonrebreather mask) and use at onset of headache for 15 minutes).   Please advise.  (there is order for specifically DME home oxygen in Epic).

## 2018-04-10 NOTE — Telephone Encounter (Signed)
Yes, portable o2. -VRP

## 2018-04-10 NOTE — Telephone Encounter (Signed)
New order faxed. [phone]. (fax confirmation received).

## 2018-04-14 ENCOUNTER — Telehealth: Payer: Self-pay | Admitting: Diagnostic Neuroimaging

## 2018-04-14 ENCOUNTER — Encounter: Payer: Self-pay | Admitting: *Deleted

## 2018-04-14 NOTE — Telephone Encounter (Signed)
Letter signed, at front desk for pick up.

## 2018-04-14 NOTE — Telephone Encounter (Addendum)
[facility], spoke with Siva to give him a verbal Rx for the injection needles. He stated that the patient only needs to come in and ask for the needles. This RN advised him the patient stated he was told he should get a prescription for needles or he'd have to pay out of pocket. The pharmacist stated his insurance will not pay for needles, stated insurance typically never does.  Attempted to reach patient to advise. Phone continuously rang.

## 2018-04-14 NOTE — Telephone Encounter (Signed)
Ok, agree with plan. -VRP

## 2018-04-14 NOTE — Telephone Encounter (Signed)
Spoke with patient and advised him per Enid Derry at [facility] his insurance will not pay for needles. Advised he go to  [facility] and ask for needles. Advised him that when he is about to run out of current sumatriptan Rx he call this office. Dr Leta Baptist will send in new Rx for auto injectors. He then stated he had just picked up his O2 and wasn't sure how much O2 to use. This RN read the Rx details per Dr Leta Baptist: Use 12 liters via non re breather mask for 15 minutes as needed for onset of headache). Repeated these instructions. Advised him that Dr Leta Baptist will write letter to be out of work another 1-2 weeks. The patient asked it be written for 2 weeks unless he is feeling better and headaches are under control. He may then return to work prior to two weeks. Patient will pick up letter tomorrow. Advised him it'll be at front desk in envelop. He verbalized understanding, appreciation. Letter on Dr [employer] desk for review, signature.

## 2018-04-14 NOTE — Telephone Encounter (Signed)
Per Dr Leta Baptist, Okay to extend out of work another 1-2 weeks. Also, please call pharmacy and change to sumatriptan autoinjector (pen). [facility], spoke with pharmacist, [place] who stated the patient has picked up sumatriptan solution. The pharmacy cannot accept it back. Siva stated for his next refill they can change Rx to autoinjector, but not at this time. Will discuss with Dr Leta Baptist.

## 2018-04-14 NOTE — Telephone Encounter (Signed)
Spoke with patient who stated he is still having headaches. Today he called out of work, and called his HR dept. They told him to call here to extend his time out of work.  This RN asked what medications he is taking for his headache. He stated he has not used any Sumatriptan injections because he has to buy the syringes as they were not included with Rx. The pharmacist told him he could pay out of pocket or get a prescription for them.  He is asking for a prescription for the syringes, needles.  He stopped taking Verapamil last Friday stating it made him dizzy and "feel funny".  He is taking Fioricet every 6 hours, denied taking other medications. He has not picked up the oxygen yet, but stated he plans to go to [facility] to fill out paperwork and get O2.Marland Kitchen He stated if Dr Leta Baptist writes a letter he will have his wife pick it up. This RN stated will discuss with Dr Leta Baptist and call him back.  He verbalized understanding, appreciation.

## 2018-04-14 NOTE — Telephone Encounter (Signed)
Pt has returned call to [place], he is asking for a call back

## 2018-04-14 NOTE — Telephone Encounter (Signed)
Pt requesting a call stating due to h/a he would like a few more days until returning back to work. Would also like to discuss medication SUMAtriptan (IMITREX) 6 MG/0.5ML SOLN injection did not wish to go into detail with me. Please advise

## 2018-04-16 ENCOUNTER — Telehealth: Payer: Self-pay | Admitting: *Deleted

## 2018-04-16 NOTE — Telephone Encounter (Signed)
Received call from [place] with [employer] asking about the patient returning to work. This RN informed her Dr Leta Baptist wrote a letter on 04/14/18, read letter to her. Advised her the patient was aware the letter was ready for him to pick up. She stated that was all the information she needed, thanked this Therapist, sports.

## 2018-04-16 NOTE — Telephone Encounter (Signed)
Pt [place] form on [employer].

## 2018-04-17 DIAGNOSIS — Z0289 Encounter for other administrative examinations: Secondary | ICD-10-CM

## 2018-04-17 NOTE — Telephone Encounter (Addendum)
Received another fax from [employer] on patient's short term disability claim, re: restrictions form and request for documents. Called [place] and advised her that the request to return the completed form by Oct 8th will not be possible. Informed her Dr Leta Baptist is out of the office until Oct 9th.  She requested the 04/08/18 office note and letter to return to work be faxed today, and the restrictions form be faxed when completed next week.  Dr Bernestine Amass 04/08/18 office note, medication list and letter dated 04/14/18 faxed to [place]. Restrictions form on Dr [employer] desk for completion, signature when he returns to office next week.

## 2018-04-29 NOTE — Telephone Encounter (Signed)
Patient called and stated that his first day at work was yesterday. He missed work because of a migraine. He wants to know what he should do about work because "he cant keep racking up points because of my headaches". He needs refills on imitrex and verapamil. He would also like to speak with the nurse regarding some questions. Please call and advise. Please call [phone].

## 2018-04-29 NOTE — Telephone Encounter (Signed)
Patient called back stating he spoke with [facility] to reopen his case. Does he need another extension to be off?

## 2018-04-29 NOTE — Telephone Encounter (Signed)
Spoke with patient and informed him he has refills on sumatriptan and verapamil. He stated he was unaware. Also advised him his PCP prescribes Verapamil. He stated his insurance will now require [facility], and he will contact PCP.  He stated he worked yesterday but was out today with a headache. He stated between 04/14/18 and 04/28/18 while he was out of work on short term disability he had at least 3 headaches daily, lasting about 1 hour each. He stated even after the injection it gets worse then calms down. He stated he uses O2 at onset of headaches which helps as well. He stated the headaches are severe when they begin, and headaches make his head sore. He stated he is unsure what to do about missing work. This RN advised his disability papers are on Dr [employer] desk for completion and will get faxed to [place] by the end of this week.  Advised he contact them to discuss what else he may need to do re: missing work.  Advised him this RN will let Dr Leta Baptist know of his current concerns and call him back. He verbalized understanding, appreciation.

## 2018-04-30 ENCOUNTER — Other Ambulatory Visit: Payer: Self-pay | Admitting: Diagnostic Neuroimaging

## 2018-04-30 ENCOUNTER — Encounter: Payer: Self-pay | Admitting: *Deleted

## 2018-04-30 ENCOUNTER — Other Ambulatory Visit: Payer: Self-pay

## 2018-04-30 ENCOUNTER — Telehealth: Payer: Self-pay

## 2018-04-30 DIAGNOSIS — R0789 Other chest pain: Secondary | ICD-10-CM

## 2018-04-30 MED ORDER — VERAPAMIL HCL ER 240 MG PO CP24: 240.0000 mg | ORAL_CAPSULE | Freq: Every day | ORAL | 2 refills | Status: DC

## 2018-04-30 MED ORDER — TOPIRAMATE 50 MG PO TABS: 50.0000 mg | ORAL_TABLET | Freq: Two times a day (BID) | ORAL | 6 refills | Status: DC

## 2018-04-30 NOTE — Progress Notes (Signed)
Patient continuing with HA 3x per day. Severe. Missing work.   Will write out of work x 2 more weeks.   Will try add on topiramate in addition to verapamil.  Continue oxygen and sumatriptan.   Meds ordered this encounter  Medications  . topiramate (TOPAMAX) 50 MG tablet    Sig: Take 1 tablet (50 mg total) by mouth 2 (two) times daily.    Dispense:  60 tablet    Refill:  [place], MD 29/56/2130, 8:65 PM Certified in Neurology, Neurophysiology and Neuroimaging  [facility] [address], [place], [place] [phone]

## 2018-04-30 NOTE — Telephone Encounter (Addendum)
Discussed with Dr Leta Baptist. Called patient and advised him Dr Leta Baptist wants him to continue with O2 therapy, Sumatriptan, and he has prescribed topiramate to take as preventative. Advised he begin taking one tab at night for several days , and he can increase to take 1 tab twice daily if needed.  Informed him the papers are ready to be faxed to [place].  Advised him Dr Leta Baptist will write a letter, and the patient requested it be written for him to be out of work beginning 04/29/18 and return 05/05/18. This RN advised the letter will be ready for pick up tomorrow.  He verbalized understanding, appreciation. [employer] papers sent to [place] for processing. Letter for patient's return to work to be faxed with other [place] paperwork. Letter for patient is at front desk for pick up.

## 2018-04-30 NOTE — Telephone Encounter (Signed)
Patient called and stated that he can no longer get Verapamil filled at [facility] due to his insurance. [facility] Please advise patient.

## 2018-04-30 NOTE — Telephone Encounter (Signed)
[facility] [address]  Patient notified

## 2018-05-22 ENCOUNTER — Other Ambulatory Visit: Payer: Self-pay | Admitting: Diagnostic Neuroimaging

## 2018-05-22 NOTE — Telephone Encounter (Signed)
Patient requested 90 days supply. Escribed to disp #180, 1 refill. Original refill was x 7 months.

## 2018-07-23 ENCOUNTER — Other Ambulatory Visit: Payer: Self-pay | Admitting: Medical

## 2018-07-23 DIAGNOSIS — R0789 Other chest pain: Secondary | ICD-10-CM

## 2018-07-23 NOTE — Telephone Encounter (Signed)
Is this ok to refill?  

## 2018-07-23 NOTE — Telephone Encounter (Signed)
This is suppose to be refilled by neurology since they manage his headaches.   Have pharmacy send to Dr. Tish Frederickson

## 2018-08-07 ENCOUNTER — Telehealth: Payer: Self-pay | Admitting: *Deleted

## 2018-08-07 NOTE — Telephone Encounter (Signed)
I called pt.  He has new insurance now, [employer]  ID# [id number] BIN [id number], PCN [id number]  [id number].  CMM Key [id number]  intitated for sumatriptan injectable 6mg /0.72ml onset of migraine and repeat in 2 hours prn, max 2 inj in 24hours.   approved CASE ID [id number]  Good thru 07-16-18 thru 02-02-19  PA approval # [id number].  Relayed to pt he verbalized understanding.

## 2018-10-08 ENCOUNTER — Ambulatory Visit: Payer: 59 | Admitting: Diagnostic Neuroimaging

## 2019-04-17 DIAGNOSIS — B342 Coronavirus infection, unspecified: Secondary | ICD-10-CM | POA: Diagnosis not present

## 2019-04-17 DIAGNOSIS — Z20828 Contact with and (suspected) exposure to other viral communicable diseases: Secondary | ICD-10-CM | POA: Diagnosis not present

## 2019-04-30 ENCOUNTER — Ambulatory Visit (INDEPENDENT_AMBULATORY_CARE_PROVIDER_SITE_OTHER): Payer: BC Managed Care – PPO | Admitting: Medical

## 2019-04-30 ENCOUNTER — Other Ambulatory Visit: Payer: Self-pay

## 2019-04-30 VITALS — Temp 96.3°F | Ht 65.0 in | Wt 173.0 lb

## 2019-04-30 DIAGNOSIS — R0982 Postnasal drip: Secondary | ICD-10-CM | POA: Diagnosis not present

## 2019-04-30 DIAGNOSIS — U071 COVID-19: Secondary | ICD-10-CM | POA: Insufficient documentation

## 2019-04-30 DIAGNOSIS — R0602 Shortness of breath: Secondary | ICD-10-CM | POA: Insufficient documentation

## 2019-04-30 DIAGNOSIS — R05 Cough: Secondary | ICD-10-CM

## 2019-04-30 DIAGNOSIS — R059 Cough, unspecified: Secondary | ICD-10-CM | POA: Insufficient documentation

## 2019-04-30 MED ORDER — PREDNISONE 10 MG PO TABS: ORAL_TABLET | ORAL | 0 refills | Status: DC

## 2019-04-30 MED ORDER — ALBUTEROL SULFATE HFA 108 (90 BASE) MCG/ACT IN AERS: 2.0000 | INHALATION_SPRAY | Freq: Four times a day (QID) | RESPIRATORY_TRACT | 0 refills | Status: DC | PRN

## 2019-04-30 NOTE — Progress Notes (Addendum)
Subjective:     Patient ID: Richard Hughes, male   DOB: 17-Jan-1971, 48 y.o.   MRN: [id number]  This visit type was conducted due to national recommendations for restrictions regarding the COVID-19 Pandemic (e.g. social distancing) in an effort to limit this patient's exposure and mitigate transmission in our community.  Due to their co-morbid illnesses, this patient is at least at moderate risk for complications without adequate follow up.  This format is felt to be most appropriate for this patient at this time.    Documentation for virtual audio and video telecommunications through Zoom encounter:  The patient was located at home. The provider was located in the office. The patient did consent to this visit and is aware of possible charges through their insurance for this visit.  The other persons participating in this telemedicine service were none. Time spent on call was 20 minutes and in review of previous records >20 minutes total.  This virtual service is not related to other E/M service within previous 7 days.   HPI Chief Complaint  Patient presents with  . Follow-up    positive covid-no taste or smell with sob-got results 04/20/19   Virtual consult today for covid +.  Symptoms began 7 days ago.  A coworker got sick that same week, and he started feeling symptoms symptoms about 4 days later.  He went to [facility] the next day. Was tested 04/17/2019 at [facility] on [address] results back on 04/21/2019, came back positive.   Symptoms still include drainage down back of throat, some cough, feels phlegm in throat, no taste, no smell sense, slight SOB.   No fever.   No nausea, no vomiting.  Had some loose stools the first few days.  Started off taking Theraflu and Mucinex.  Feels like the drainage is not improving on current medication.  Not taking any medications regularly. Lives with wife and son.  Son is 35yo.  They are healthy, they tested negative for Covid.  No other aggravating  or relieving factors. No other complaint.  Review of Systems As in subjective    Objective:   Physical Exam Due to coronavirus pandemic stay at home measures, patient visit was virtual and they were not examined in person.   Temp (!) 96.3 F (35.7 C)   Ht 5\' 5"  (1.651 m)   Wt 173 lb (78.5 kg)   BMI 28.79 kg/m       Assessment:     Encounter Diagnoses  Name Primary?  . COVID-19 virus infection Yes  . Post-nasal drainage   . Cough   . SOB (shortness of breath)        Plan:     We discussed his symptoms and concerns.  We discussed the limitations of virtual consult.  He will begin Benadryl counter for drainage, he will begin albuterol to help with mild shortness of breath, rest, hydrate well.  We discussed possible complications.  Advise he call back if worsening, or if much worse go to emergency department.  I also prescribed prednisone in the event his symptoms worsen despite albuterol as discussed.  Discussed risk and benefits of medications.  We discussed that his symptoms may take another week or 2 or more to fully resolve.  We discussed quarantine measures which he is doing.  Follow-up as needed  Jodan was seen today for follow-up.  Diagnoses and all orders for this visit:  COVID-19 virus infection -     Temperature monitoring; Future  Post-nasal drainage -     Temperature monitoring; Future  Cough -     Temperature monitoring; Future  SOB (shortness of breath)  Other orders -     Discontinue: albuterol (VENTOLIN HFA) 108 (90 Base) MCG/ACT inhaler; Inhale 2 puffs into the lungs every 6 (six) hours as needed for wheezing or shortness of breath. -     Discontinue: predniSONE (DELTASONE) 10 MG tablet; 6/5/4/3/2/1 taper 6 tablets day 1, 5 tabs day 2, 4 tablets day 3, 3 tablets day 4, 2 tablets day 5, 1 tablet day 6 -     predniSONE (DELTASONE) 10 MG tablet; 6/5/4/3/2/1 taper -     albuterol (VENTOLIN HFA) 108 (90 Base) MCG/ACT inhaler; Inhale 2 puffs into the lungs  every 6 (six) hours as needed for wheezing or shortness of breath. -     MYCHART COVID-19 HOME MONITORING PROGRAM

## 2019-05-05 ENCOUNTER — Encounter (INDEPENDENT_AMBULATORY_CARE_PROVIDER_SITE_OTHER): Payer: Self-pay

## 2019-05-06 ENCOUNTER — Encounter (INDEPENDENT_AMBULATORY_CARE_PROVIDER_SITE_OTHER): Payer: Self-pay

## 2019-05-06 DIAGNOSIS — Z20828 Contact with and (suspected) exposure to other viral communicable diseases: Secondary | ICD-10-CM | POA: Diagnosis not present

## 2019-05-07 ENCOUNTER — Encounter (INDEPENDENT_AMBULATORY_CARE_PROVIDER_SITE_OTHER): Payer: Self-pay

## 2019-05-08 ENCOUNTER — Encounter (INDEPENDENT_AMBULATORY_CARE_PROVIDER_SITE_OTHER): Payer: Self-pay

## 2019-05-19 ENCOUNTER — Other Ambulatory Visit: Payer: Self-pay | Admitting: Medical

## 2019-08-25 DIAGNOSIS — Z20822 Contact with and (suspected) exposure to covid-19: Secondary | ICD-10-CM | POA: Diagnosis not present

## 2020-01-01 ENCOUNTER — Other Ambulatory Visit: Payer: Self-pay

## 2020-01-01 ENCOUNTER — Encounter: Payer: Self-pay | Admitting: Medical

## 2020-01-01 ENCOUNTER — Ambulatory Visit: Payer: BC Managed Care – PPO | Admitting: Medical

## 2020-01-01 VITALS — BP 98/70 | HR 66 | Ht 65.0 in | Wt 176.2 lb

## 2020-01-01 DIAGNOSIS — R109 Unspecified abdominal pain: Secondary | ICD-10-CM | POA: Diagnosis not present

## 2020-01-01 DIAGNOSIS — M545 Low back pain, unspecified: Secondary | ICD-10-CM

## 2020-01-01 DIAGNOSIS — R10A0 Flank pain, unspecified side: Secondary | ICD-10-CM

## 2020-01-01 DIAGNOSIS — T148XXA Other injury of unspecified body region, initial encounter: Secondary | ICD-10-CM | POA: Insufficient documentation

## 2020-01-01 DIAGNOSIS — Z125 Encounter for screening for malignant neoplasm of prostate: Secondary | ICD-10-CM

## 2020-01-01 LAB — POCT URINALYSIS DIP (PROADVANTAGE DEVICE)
Bilirubin, UA: NEGATIVE
Blood, UA: NEGATIVE
Glucose, UA: NEGATIVE mg/dL
Ketones, POC UA: NEGATIVE mg/dL
Leukocytes, UA: NEGATIVE
Nitrite, UA: NEGATIVE
Protein Ur, POC: NEGATIVE mg/dL
Specific Gravity, Urine: 1.02
Urobilinogen, Ur: NEGATIVE
pH, UA: 6 (ref 5.0–8.0)

## 2020-01-01 MED ORDER — SULFAMETHOXAZOLE-TRIMETHOPRIM 800-160 MG PO TABS: 1.0000 | ORAL_TABLET | Freq: Two times a day (BID) | ORAL | 0 refills | Status: DC

## 2020-01-01 NOTE — Progress Notes (Signed)
Subjective: Chief Complaint  Patient presents with   Flank Pain    left side pain x3 days-rash on both side that comes and goes(more like a bruise)   Here for left sided abdominal flank pain.  Started 4 day ago in left upper back but has had some sharp pains across lower abdomen down through penis.  Has some urine urgency.  No fever.  No nausea, no vomiting.  No loose stools.   No blood in urine.  No penile discharge.   No stream changes.   Married, no concern for STD.   No prior prostate infection, no prior kidney stone.   No debris or stone has passed.  Also has noticed rash/bruise along both hips off and on for the past year.  Wondered about kidney infection.  Has felt some lightheadedness, mild.  No other aggravating or relieving factors. No other complaint.   Past Medical History:  Diagnosis Date   Concussion 01/2018   after fainting.   H/O skin graft    Migraine    Paralyzed vocal cords 1995   resolved   No current outpatient medications on file prior to visit.   No current facility-administered medications on file prior to visit.   ROS as in subjective    Objective: BP 98/70    Pulse 66    Ht 5\' 5"  (1.651 m)    Wt 176 lb 3.2 oz (79.9 kg)    SpO2 98%    BMI 29.32 kg/m   Wt Readings from Last 3 Encounters:  01/01/20 176 lb 3.2 oz (79.9 kg)  04/30/19 173 lb (78.5 kg)  04/08/18 179 lb 3.2 oz (81.3 kg)   BP Readings from Last 3 Encounters:  01/01/20 98/70  04/08/18 116/74  04/02/18 124/76    General appearence: alert, no distress, WD/WN Heart: RRR, normal S1, S2, no murmurs Abdomen: +bs, soft, non tender, non distended, no masses, no hepatomegaly, no splenomegaly Back: nontender GU: normal male, circ, no mass, no lymphadenopathy DRE: anus normal tone, questionable boggy prostate, no enlargement Pulses: 2+ symmetric, upper and lower extremities, normal cap refill    Assessment: Encounter Diagnoses  Name Primary?   Abdominal pain, unspecified abdominal  location Yes   Low back pain, unspecified back pain laterality, unspecified chronicity, unspecified whether sciatica present    Flank pain    Screening for prostate cancer    Bruising      Plan: Discussed symptoms, possible differential, prostatitis, renal stone, mass, other.  Labs today.   Hydrate well, rest.  Consider CT abdomen/pelvis pending labs.    Begin Bactrim, Tylenol prn, strain urine in the event of stone, and await labs  If worse over weekend, go to the emergency dept.    Richard Hughes was seen today for flank pain.  Diagnoses and all orders for this visit:  Abdominal pain, unspecified abdominal location -     POCT Urinalysis DIP (Proadvantage Device) -     PSA -     CBC with Differential/Platelet -     Comprehensive metabolic panel  Low back pain, unspecified back pain laterality, unspecified chronicity, unspecified whether sciatica present -     POCT Urinalysis DIP (Proadvantage Device) -     PSA -     CBC with Differential/Platelet -     Comprehensive metabolic panel  Flank pain -     POCT Urinalysis DIP (Proadvantage Device) -     PSA -     CBC with Differential/Platelet -     Comprehensive  metabolic panel  Screening for prostate cancer -     PSA -     CBC with Differential/Platelet -     Comprehensive metabolic panel  Bruising  Other orders -     sulfamethoxazole-trimethoprim (BACTRIM DS) 800-160 MG tablet; Take 1 tablet by mouth 2 (two) times daily.  f/u pending labs

## 2020-01-02 LAB — COMPREHENSIVE METABOLIC PANEL
ALT: 17 IU/L (ref 0–44)
AST: 20 IU/L (ref 0–40)
Albumin/Globulin Ratio: 2 (ref 1.2–2.2)
Albumin: 4.8 g/dL (ref 4.0–5.0)
Alkaline Phosphatase: 49 IU/L (ref 48–121)
BUN/Creatinine Ratio: 19 (ref 9–20)
BUN: 24 mg/dL (ref 6–24)
Bilirubin Total: 0.6 mg/dL (ref 0.0–1.2)
CO2: 25 mmol/L (ref 20–29)
Calcium: 9.8 mg/dL (ref 8.7–10.2)
Chloride: 101 mmol/L (ref 96–106)
Creatinine, Ser: 1.25 mg/dL (ref 0.76–1.27)
GFR calc Af Amer: 79 mL/min/{1.73_m2} (ref >59)
GFR calc non Af Amer: 68 mL/min/{1.73_m2} (ref >59)
Globulin, Total: 2.4 g/dL (ref 1.5–4.5)
Glucose: 80 mg/dL (ref 65–99)
Potassium: 4.5 mmol/L (ref 3.5–5.2)
Sodium: 140 mmol/L (ref 134–144)
Total Protein: 7.2 g/dL (ref 6.0–8.5)

## 2020-01-02 LAB — CBC WITH DIFFERENTIAL/PLATELET
Basophils Absolute: 0 10*3/uL (ref 0.0–0.2)
Basos: 1 %
EOS (ABSOLUTE): 0.1 10*3/uL (ref 0.0–0.4)
Eos: 1 %
Hematocrit: 44.9 % (ref 37.5–51.0)
Hemoglobin: 15.1 g/dL (ref 13.0–17.7)
Immature Grans (Abs): 0 10*3/uL (ref 0.0–0.1)
Immature Granulocytes: 1 %
Lymphocytes Absolute: 1.3 10*3/uL (ref 0.7–3.1)
Lymphs: 25 %
MCH: 31.7 pg (ref 26.6–33.0)
MCHC: 33.6 g/dL (ref 31.5–35.7)
MCV: 94 fL (ref 79–97)
Monocytes Absolute: 0.5 10*3/uL (ref 0.1–0.9)
Monocytes: 9 %
Neutrophils Absolute: 3.3 10*3/uL (ref 1.4–7.0)
Neutrophils: 63 %
Platelets: 265 10*3/uL (ref 150–450)
RBC: 4.76 x10E6/uL (ref 4.14–5.80)
RDW: 11.9 % (ref 11.6–15.4)
WBC: 5.2 10*3/uL (ref 3.4–10.8)

## 2020-01-02 LAB — PSA: Prostate Specific Ag, Serum: 0.7 ng/mL (ref 0.0–4.0)

## 2020-03-05 DIAGNOSIS — Z20822 Contact with and (suspected) exposure to covid-19: Secondary | ICD-10-CM | POA: Diagnosis not present

## 2020-03-08 ENCOUNTER — Other Ambulatory Visit: Payer: Self-pay | Admitting: Medical

## 2020-03-08 ENCOUNTER — Telehealth: Payer: Self-pay | Admitting: Medical

## 2020-03-08 MED ORDER — SUMATRIPTAN SUCCINATE 6 MG/0.5ML ~~LOC~~ SOLN: 6.0000 mg | SUBCUTANEOUS | 1 refills | Status: DC | PRN

## 2020-03-08 NOTE — Telephone Encounter (Signed)
Left detail message on machine for patient. 

## 2020-03-08 NOTE — Telephone Encounter (Signed)
Pt is scheduled for virtual tomorrow due to positive Covid. His main side effect is severe headache and he has been taking Sumatriptan and wanted to get a new prescription for it

## 2020-03-08 NOTE — Telephone Encounter (Signed)
follow-up tomorrow as planned  I would recommend taking Tylenol 500 mg over-the-counter twice daily the next few days for the headache instead of the sumatriptan

## 2020-03-09 ENCOUNTER — Other Ambulatory Visit: Payer: Self-pay

## 2020-03-09 ENCOUNTER — Telehealth: Payer: BC Managed Care – PPO | Admitting: Medical

## 2020-03-09 VITALS — Ht 65.0 in | Wt 175.0 lb

## 2020-03-09 DIAGNOSIS — Z9889 Other specified postprocedural states: Secondary | ICD-10-CM

## 2020-03-09 DIAGNOSIS — U071 COVID-19: Secondary | ICD-10-CM

## 2020-03-09 DIAGNOSIS — G44001 Cluster headache syndrome, unspecified, intractable: Secondary | ICD-10-CM | POA: Diagnosis not present

## 2020-03-09 DIAGNOSIS — R059 Cough, unspecified: Secondary | ICD-10-CM

## 2020-03-09 DIAGNOSIS — Z87828 Personal history of other (healed) physical injury and trauma: Secondary | ICD-10-CM | POA: Diagnosis not present

## 2020-03-09 DIAGNOSIS — R05 Cough: Secondary | ICD-10-CM

## 2020-03-09 MED ORDER — SUMATRIPTAN SUCCINATE 6 MG/0.5ML ~~LOC~~ SOLN: 6.0000 mg | SUBCUTANEOUS | 0 refills | Status: DC | PRN

## 2020-03-09 NOTE — Progress Notes (Signed)
Subjective:     Patient ID: Richard Hughes, male   DOB: [birth year], 49 y.o.   MRN: [id number]  This visit type was conducted due to national recommendations for restrictions regarding the COVID-19 Pandemic (e.g. social distancing) in an effort to limit this patient's exposure and mitigate transmission in our community.  Due to their co-morbid illnesses, this patient is at least at moderate risk for complications without adequate follow up.  This format is felt to be most appropriate for this patient at this time.    Documentation for virtual audio and video telecommunications through [place] encounter:  The patient was located at home. The provider was located in the office. The patient did consent to this visit and is aware of possible charges through their insurance for this visit.  The other persons participating in this telemedicine service were none. Time spent on call was 20 minutes and in review of previous records 20 minutes total.  This virtual service is not related to other E/M service within previous 7 days.   HPI Chief Complaint  Patient presents with  . Follow-up    + COVID-headache,diarrhea, bodyache,loss of taste and smell    Virtual consult for positive Covid.  He was tested +4 days ago.  His main concern right now is headaches.  He has a history of cluster headaches and the Covid seem to be triggering cluster headaches.  He needs a refill on sumatriptan injections as this the only thing that seems to be working.  Tylenol or other over-the-counter analgesics not helping.  His main symptoms are headaches and loose or soft stools.  Not frank diarrhea though.  He has had other symptoms including body aches, some limited cough, congestion, little runny nose, but mainly headache and soft stools.  No other sick contacts.  No other complaint.  Past Medical History:  Diagnosis Date  . Concussion 01/2018   after fainting.  . H/O skin graft   . Migraine   . Paralyzed vocal  cords 1995   resolved   Current Outpatient Medications on File Prior to Visit  Medication Sig Dispense Refill  . SUMAtriptan (IMITREX) 6 MG/0.5ML SOLN injection Inject 0.5 mLs (6 mg total) into the skin every 2 (two) hours as needed for up to 2 doses for migraine or headache. 0.5 mL 1   No current facility-administered medications on file prior to visit.   ROS as in subjective    Review of Systems As in subjective    Objective:   Physical Exam Due to coronavirus pandemic stay at home measures, patient visit was virtual and they were not examined in person.  He is talking complete sentences, not dyspneic Ht 5\' 5"  (1.651 m)   Wt 175 lb (79.4 kg)   BMI 29.12 kg/m       Assessment:     Encounter Diagnoses  Name Primary?  . COVID-19 virus infection Yes  . Intractable cluster headache syndrome, unspecified chronicity pattern   . History of lung surgery   . History of trauma of chest   . Cough        Plan:     We discussed his Covid positive infection.  This seems to be triggering his cluster headaches or other headache syndrome.  He can use his injections below the refill today since this is helping more than over-the-counter Tylenol..  We discussed the other recommendations below and follow-up by phone if not improving in next 3 to 4 days  General recommendations: I recommend you  rest, hydrate well with water and clear fluids throughout the day.   Drink enough water and clear fluids so that your urine is clear.   You can use Tylenol for pain or fever every 4-6 hours. You can use over the counter Delsym for cough. You can use over the counter Emetrol for nausea.    Consider using over the counter Emergen-C Immune + over the counter for the next week.  If you need any medications from the pharmacy, have a friend or family member pick them up from the pharmacy for you.  Have them drop off the medications at your home to keep you from having to go into the pharmacy and  potentially exposure others.   If this is not possible, see if your pharmacy does home delivery.  Or worse case scenario, go through the drive through at your pharmacy, wear your mask, use hand sanitizer before touching anything in the drive through transaction, and limit interaction with the store personally, particularly staying > 6 feet apart.  Over the next few days if you are having worse trouble breathing, if you are very weak, have persistent fever 101 or higher consistently despite Tylenol, uncontrollable nausea and vomiting, or feel very dehydrated, then call or go to the emergency department.    If you have other questions or have other symptoms or questions you are concerned about then please make a virtual visit with your primary care provider.  Covid symptoms such as fatigue and cough can linger over 2 weeks, even after the initial fever, aches, chills, and other initial symptoms.     Self Quarantine: The CDC, Centers for Disease Control has recommended a self quarantine of 10 -14 days from the start of your illness until you are symptom-free including at least 24 hours of no symptoms including no fever, no shortness of breath, and no body aches and chills, by day 10 before returning to work or general contact with the public.  What does self quarantine mean: avoiding contact with people as much as possible.   Particularly in your house, isolate your self from others in a separate room, wear a mask when possible in the room, particularly if coughing a lot.   Have others bring food, water, medications, etc., to your door, but avoid direct contact with your household contacts during this time to avoid spreading the infection to them.   If you have a separate bathroom and living quarters during the next 2 weeks away from others, that would be preferable.    If you can't completely isolate, then wear a mask, wash hands frequently with soap and water for at least 15 seconds, minimize close  contact with others, and have a friend or family member check regularly from a distance to make sure you are not getting seriously worse.     You should not be going out in public, should not be going to stores, to work or other public places until all your symptoms have resolved and at least 10 days + 24 hours of no symptoms at all have transpired.   Ideally you should avoid contact with others for a full 10 days if possible.  One of the goals is to limit spread to high risk people; people that are older and elderly, people with multiple health issues like diabetes, heart disease, lung disease, and anybody that has weakened immune systems such as people with cancer or on immunosuppressive therapy.      [place] was seen today for  follow-up.  Diagnoses and all orders for this visit:  COVID-19 virus infection  Intractable cluster headache syndrome, unspecified chronicity pattern  History of lung surgery  History of trauma of chest  Cough  Other orders -     SUMAtriptan (IMITREX) 6 MG/0.5ML SOLN injection; Inject 0.5 mLs (6 mg total) into the skin every 2 (two) hours as needed for migraine or headache. May repeat in 2 hours if headache persists or recurs.  f/u soon for fasting physical

## 2020-04-06 ENCOUNTER — Telehealth: Payer: BC Managed Care – PPO | Admitting: Medical

## 2020-04-06 ENCOUNTER — Telehealth: Payer: Self-pay | Admitting: Medical

## 2020-04-06 DIAGNOSIS — G8929 Other chronic pain: Secondary | ICD-10-CM | POA: Insufficient documentation

## 2020-04-06 DIAGNOSIS — F4321 Adjustment disorder with depressed mood: Secondary | ICD-10-CM | POA: Diagnosis not present

## 2020-04-06 DIAGNOSIS — Z8616 Personal history of COVID-19: Secondary | ICD-10-CM | POA: Diagnosis not present

## 2020-04-06 DIAGNOSIS — R519 Headache, unspecified: Secondary | ICD-10-CM | POA: Diagnosis not present

## 2020-04-06 DIAGNOSIS — Z8669 Personal history of other diseases of the nervous system and sense organs: Secondary | ICD-10-CM | POA: Insufficient documentation

## 2020-04-06 MED ORDER — SUMATRIPTAN SUCCINATE 6 MG/0.5ML ~~LOC~~ SOLN: 6.0000 mg | SUBCUTANEOUS | 1 refills | Status: DC | PRN

## 2020-04-06 MED ORDER — METOPROLOL SUCCINATE ER 25 MG PO TB24: 25.0000 mg | ORAL_TABLET | Freq: Every day | ORAL | 2 refills | Status: DC

## 2020-04-06 MED ORDER — TRAMADOL HCL 50 MG PO TABS: 50.0000 mg | ORAL_TABLET | Freq: Three times a day (TID) | ORAL | 0 refills | Status: AC | PRN

## 2020-04-06 NOTE — Telephone Encounter (Signed)
Pt was scheduled for virtual; appt

## 2020-04-06 NOTE — Progress Notes (Signed)
MyChart Video Visit    Virtual Visit via Video Note   This visit type was conducted due to national recommendations for restrictions regarding the COVID-19 Pandemic (e.g. social distancing) in an effort to limit this patient's exposure and mitigate transmission in our community. This patient is at least at moderate risk for complications without adequate follow up. This format is felt to be most appropriate for this patient at this time. Physical exam was limited by quality of the video and audio technology used for the visit.   Patient location: Home Provider location: Office  I discussed the limitations of evaluation and management by telemedicine and the availability of in person appointments. The patient expressed understanding and agreed to proceed.  Patient: Richard Hughes   DOB: [birth year]   49 y.o. Male  MRN: [id number] Visit Date: 04/06/2020  Today's healthcare provider: Dorothea Ogle, PA-C   Chief Complaint  Patient presents with  . Headache  I,Shane Tysinger,acting as a Education administrator for [employer], PA-C.,have documented all relevant documentation on the behalf of Dorothea Ogle, PA-C,as directed by  [employer], PA-C while in the presence of [employer], PA-C.  Subjective    Headache  This is a recurrent problem. The current episode started 1 to 4 weeks ago. The problem occurs daily. The problem has been gradually improving. The pain is located in the frontal and right unilateral region. The pain radiates to the face, left neck and right neck. The pain quality is similar to prior headaches. The quality of the pain is described as aching, band-like, boring, dull, pulsating, sharp, shooting, stabbing, throbbing, squeezing and thunderclap. The pain is at a severity of 7/10. The pain is severe. Associated symptoms include blurred vision, dizziness, eye pain, a loss of balance, nausea, numbness, a visual change and weakness. Pertinent negatives include no back pain, hearing  loss or tingling. The symptoms are aggravated by bright light, noise and emotional stress. He has tried darkened room and Excedrin (Imitrex injections) for the symptoms. The treatment provided mild relief. His past medical history is significant for cluster headaches. There is no history of migraines in the family.    During covid, constant all day every day.  This past Sunday horrible headahce, constant all day.  While at work, headaches bad.    Medications: Outpatient Medications Prior to Visit  Medication Sig  . SUMAtriptan (IMITREX) 6 MG/0.5ML SOLN injection Inject 0.5 mLs (6 mg total) into the skin every 2 (two) hours as needed for migraine or headache. May repeat in 2 hours if headache persists or recurs.  . [DISCONTINUED] SUMAtriptan (IMITREX) 6 MG/0.5ML SOLN injection Inject 0.5 mLs (6 mg total) into the skin every 2 (two) hours as needed for up to 2 doses for migraine or headache.   No facility-administered medications prior to visit.    Review of Systems  HENT: Negative for hearing loss.   Eyes: Positive for blurred vision and pain.  Gastrointestinal: Positive for nausea.  Musculoskeletal: Negative for back pain.  Neurological: Positive for dizziness, weakness, numbness, headaches and loss of balance. Negative for tingling.      Objective    There were no vitals taken for this visit.  BP Readings from Last 3 Encounters:  01/01/20 98/70  04/08/18 116/74  04/02/18 124/76   There were no vitals taken for this visit.   Wt Readings from Last 3 Encounters:  03/09/20 175 lb (79.4 kg)  01/01/20 176 lb 3.2 oz (79.9 kg)  04/30/19 173 lb (78.5 kg)  Physical Exam     Assessment & Plan      I expressed my sympathy for his recent loss of his father.  We discussed several factors that could be triggering his headaches.  He can use Ultram for acute therapy since his regular therapy is not working.  Refilled the Imitrex for as needed use.  Begin Toprol-XL daily for  prevention.  He notes that he did not respond to verapamil in the past for prevention  I advise he return in the next 2 weeks for physical and follow-up.  If no improvement in the short-term though call or recheck.    [name] was seen today for headache.  Diagnoses and all orders for this visit:  Chronic intractable headache, unspecified headache type  Grieving  History of COVID-19  History of cluster headache  Other orders -     traMADol (ULTRAM) 50 MG tablet; Take 1 tablet (50 mg total) by mouth every 8 (eight) hours as needed for up to 5 days. -     metoprolol succinate (TOPROL-XL) 25 MG 24 hr tablet; Take 1 tablet (25 mg total) by mouth daily. -     SUMAtriptan (IMITREX) 6 MG/0.5ML SOLN injection; Inject 0.5 mLs (6 mg total) into the skin every 2 (two) hours as needed for up to 2 doses for migraine or headache.    I discussed the assessment and treatment plan with the patient. The patient was provided an opportunity to ask questions and all were answered. The patient agreed with the plan and demonstrated an understanding of the instructions.   The patient was advised to call back or seek an in-person evaluation if the symptoms worsen or if the condition fails to improve as anticipated.   Dorothea Ogle, PA-C [place] [phone] (phone) [phone] (fax)  [facility]   F/u 2 wk

## 2020-04-06 NOTE — Telephone Encounter (Signed)
Pt called, headaches are back, pt has been out of work since yesterday and is requesting note for work also   Wants refill on sumatriptan    [facility]  [employer]

## 2020-04-07 ENCOUNTER — Encounter: Payer: Self-pay | Admitting: Medical

## 2020-04-07 ENCOUNTER — Telehealth: Payer: BC Managed Care – PPO | Admitting: Medical

## 2020-07-22 ENCOUNTER — Telehealth (INDEPENDENT_AMBULATORY_CARE_PROVIDER_SITE_OTHER): Payer: BC Managed Care – PPO | Admitting: Medical

## 2020-07-22 ENCOUNTER — Encounter: Payer: Self-pay | Admitting: Medical

## 2020-07-22 ENCOUNTER — Other Ambulatory Visit (INDEPENDENT_AMBULATORY_CARE_PROVIDER_SITE_OTHER): Payer: BC Managed Care – PPO

## 2020-07-22 VITALS — Temp 96.8°F | Ht 65.0 in | Wt 175.0 lb

## 2020-07-22 DIAGNOSIS — J029 Acute pharyngitis, unspecified: Secondary | ICD-10-CM | POA: Diagnosis not present

## 2020-07-22 DIAGNOSIS — R0981 Nasal congestion: Secondary | ICD-10-CM

## 2020-07-22 LAB — POC COVID19 BINAXNOW: SARS Coronavirus 2 Ag: NEGATIVE

## 2020-07-22 NOTE — Progress Notes (Signed)
Subjective:     Patient ID: Richard Hughes, male   DOB: Apr 04, 1971, 50 y.o.   MRN: [id number]  This visit type was conducted due to national recommendations for restrictions regarding the COVID-19 Pandemic (e.g. social distancing) in an effort to limit this patient's exposure and mitigate transmission in our community.  Due to their co-morbid illnesses, this patient is at least at moderate risk for complications without adequate follow up.  This format is felt to be most appropriate for this patient at this time.    Documentation for virtual audio and video telecommunications through [place] encounter:  The patient was located at home. The provider was located in the office. The patient did consent to this visit and is aware of possible charges through their insurance for this visit.  The other persons participating in this telemedicine service were none. Time spent on call was 20 minutes and in review of previous records 20 minutes total.  This virtual service is not related to other E/M service within previous 7 days.   HPI Chief Complaint  Patient presents with  . URI    Sore throat and runny nose. Symptoms started 07/21/20    Virtual consult for not feeling well.   He notes 1 day hx/o sore throat, runny nose.   Was bagging leaves in the yard the day before.   No fever, body aches, no chills, no NVD.  Has had some covid outbreaks at work the last few weeks.  However he wasn't around those folks.   Masks are mandatory at work anyways.      Is a little stopped up.  has't used anythign for symptoms.    His chronic headaches seemd to stop in October.  swticehd jobs at that time.  As soon as he left that job, headaches stopped.   Working at [employer].    He has covid infection twice alrady, last illness late last 2021.    No other aggravating or relieving factors. No other complaint.  Past Medical History:  Diagnosis Date  . Concussion 01/2018   after fainting.  . H/O skin  graft   . Migraine   . Paralyzed vocal cords 1995   resolved    Review of Systems As in subjective    Objective:   Physical Exam Due to coronavirus pandemic stay at home measures, patient visit was virtual and they were not examined in person.   Temp (!) 96.8 F (36 C)   Ht 5\' 5"  (1.651 m)   Wt 175 lb (79.4 kg)   BMI 29.12 kg/m      Assessment:     Encounter Diagnoses  Name Primary?  . Sore throat Yes  . Head congestion        Plan:     We discussed symptoms and concerns.  He has very minimal symptoms x1 day and has been waking up leaves.  So symptoms could be related to that or could be the start of a respiratory tract infection.  Is too early to tell.  He has had potential COVID exposure at work so he would like to come in today for COVID testing.  He will report to our office for testing as discussed, quarantine in the meantime  Assuming symptoms do progress, we discussed supportive care, can begin nasal saline flush, salt water gargles, over-the-counter cold and flu medicine or simply start with an antihistamine given the current symptoms  He has had COVID infection 2 times prior.  He has not  had the vaccine  He voiced understanding and agreement of plan  Cullan was seen today for uri.  Diagnoses and all orders for this visit:  Sore throat -     POC COVID-19 BinaxNow; Future -     Novel Coronavirus, NAA ([facility]); Future  Head congestion -     POC COVID-19 BinaxNow; Future -     Novel Coronavirus, NAA ([facility]); Future  Follow-up for testing today in our back parking lot

## 2020-07-24 LAB — SARS-COV-2, NAA 2 DAY TAT

## 2020-07-24 LAB — NOVEL CORONAVIRUS, NAA: SARS-CoV-2, NAA: NOT DETECTED

## 2020-09-07 ENCOUNTER — Encounter: Payer: Self-pay | Admitting: Medical

## 2021-03-13 ENCOUNTER — Other Ambulatory Visit: Payer: Self-pay

## 2021-03-13 ENCOUNTER — Encounter: Payer: Self-pay | Admitting: Medical

## 2021-03-13 ENCOUNTER — Telehealth: Payer: BC Managed Care – PPO | Admitting: Medical

## 2021-03-13 VITALS — Wt 170.0 lb

## 2021-03-13 DIAGNOSIS — Z9889 Other specified postprocedural states: Secondary | ICD-10-CM | POA: Diagnosis not present

## 2021-03-13 DIAGNOSIS — J029 Acute pharyngitis, unspecified: Secondary | ICD-10-CM | POA: Diagnosis not present

## 2021-03-13 DIAGNOSIS — Z20822 Contact with and (suspected) exposure to covid-19: Secondary | ICD-10-CM | POA: Diagnosis not present

## 2021-03-13 DIAGNOSIS — R0602 Shortness of breath: Secondary | ICD-10-CM | POA: Diagnosis not present

## 2021-03-13 DIAGNOSIS — Z8669 Personal history of other diseases of the nervous system and sense organs: Secondary | ICD-10-CM

## 2021-03-13 MED ORDER — SUMATRIPTAN SUCCINATE 6 MG/0.5ML ~~LOC~~ SOLN
6.0000 mg | SUBCUTANEOUS | 2 refills | Status: DC | PRN
Start: 2021-03-13 — End: 2021-06-13

## 2021-03-13 MED ORDER — METOPROLOL SUCCINATE ER 25 MG PO TB24: 25.0000 mg | ORAL_TABLET | Freq: Every day | ORAL | 1 refills | Status: DC

## 2021-03-13 MED ORDER — ALBUTEROL SULFATE HFA 108 (90 BASE) MCG/ACT IN AERS: 2.0000 | INHALATION_SPRAY | Freq: Four times a day (QID) | RESPIRATORY_TRACT | 1 refills | Status: AC | PRN

## 2021-03-13 MED ORDER — EMERGEN-C IMMUNE PLUS PO PACK: 1.0000 | PACK | Freq: Two times a day (BID) | ORAL | 0 refills | Status: DC

## 2021-03-13 NOTE — Progress Notes (Signed)
Subjective:     Patient ID: KADESH SULLO, male   DOB: 09-05-1970, 50 y.o.   MRN: [id number]  This visit type was conducted due to national recommendations for restrictions regarding the COVID-19 Pandemic (e.g. social distancing) in an effort to limit this patient's exposure and mitigate transmission in our community.  Due to their co-morbid illnesses, this patient is at least at moderate risk for complications without adequate follow up.  This format is felt to be most appropriate for this patient at this time.    Documentation for virtual audio and video telecommunications through [place] encounter:  The patient was located at home. The provider was located in the office. The patient did consent to this visit and is aware of possible charges through their insurance for this visit.  The other persons participating in this telemedicine service were none. Time spent on call was 20 minutes and in review of previous records 20 minutes total.  This virtual service is not related to other E/M service within previous 7 days.   HPI Chief Complaint  Patient presents with   Sore Throat    Sore throat, headache, SOB, chills, fatigue. Symptoms started Friday. At home covid- negative this morning   Virtual consult for illness.  He has been feeling a little short of breath for 2 weeks but in the last 3 days chills, fatigue, headache, sore throat.  He has been around 2 different people at work that have had [place] in the last week or so.  Has had some occasional wheezing.  He has some sweats, some phlegm production.  No cough though.  No severe sore throat.  No nausea vomiting or diarrhea.  No other symptoms  He needs a work note.    He has an old inhaler he has been using as prescribed in the last year he had a similar respiratory tract infection.  He needs a refill on inhaler.    Using nothing else for symptoms.  Water intake is good.  Leg swelling, no chest pain.  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  He needs a refill on his headache medication.  No recent problems with headaches but he would like to keep medications on hand  Past Surgical History:  Procedure Laterality Date   total lung removal Left    1995    Past Medical History:  Diagnosis Date   Concussion 01/2018   after fainting.   H/O skin graft    Migraine    Paralyzed vocal cords 1995   resolved    Review of Systems As in subjective    Objective:   Physical Exam Due to coronavirus pandemic stay at home measures, patient visit was virtual and they were not examined in person.   Wt 170 lb (77.1 kg)   BMI 28.29 kg/m   Gen: wd, wn, nad No obvious labored breathing or dyspnea Mildly ill-appearing      Assessment:     Encounter Diagnoses  Name Primary?   Close exposure to COVID-19 virus Yes   Sore throat    SOB (shortness of breath)    History of lung surgery    History of cluster headache         Plan:     We discussed symptoms and concerns.  I suspect COVID given his close exposure and current symptoms.  He will come in tomorrow morning in our back parking lot for COVID  screening.  We wrote a work note today for illness but we may need to extend that note if positive for COVID.  We discussed supportive measures, rest, hydration, discussed Tylenol over-the-counter for fever or aches, consider Robitussin-DM for cough and congestion if the symptoms worsen, begin vitamin pack below.  Continue albuterol as needed.  His inhaler was expired so I refilled that today.  I also refilled his headache medication since he was out of his migraine medication.  We discussed quarantine measures and advised to quarantine until we have the test result here   Mesiah was seen today for sore throat.  Diagnoses and all orders for this visit:  Close exposure to COVID-19 virus  Sore throat  SOB (shortness of breath)  History of lung  surgery  History of cluster headache  Other orders -     SUMAtriptan (IMITREX) 6 MG/0.5ML SOLN injection; Inject 0.5 mLs (6 mg total) into the skin every 2 (two) hours as needed for migraine or headache. May repeat in 2 hours if headache persists or recurs. -     metoprolol succinate (TOPROL-XL) 25 MG 24 hr tablet; Take 1 tablet (25 mg total) by mouth daily. -     albuterol (VENTOLIN HFA) 108 (90 Base) MCG/ACT inhaler; Inhale 2 puffs into the lungs every 6 (six) hours as needed for wheezing or shortness of breath. -     Multiple Vitamins-Minerals (EMERGEN-C IMMUNE PLUS) PACK; Take 1 tablet by mouth 2 (two) times daily.  F/u tomorrow for testing

## 2021-03-14 ENCOUNTER — Other Ambulatory Visit (INDEPENDENT_AMBULATORY_CARE_PROVIDER_SITE_OTHER): Payer: BC Managed Care – PPO

## 2021-03-14 DIAGNOSIS — R059 Cough, unspecified: Secondary | ICD-10-CM | POA: Diagnosis not present

## 2021-03-14 LAB — POC COVID19 BINAXNOW: SARS Coronavirus 2 Ag: NEGATIVE

## 2021-03-15 ENCOUNTER — Other Ambulatory Visit: Payer: Self-pay

## 2021-03-15 ENCOUNTER — Telehealth: Payer: Self-pay | Admitting: Medical

## 2021-03-15 ENCOUNTER — Encounter: Payer: Self-pay | Admitting: Medical

## 2021-03-15 ENCOUNTER — Ambulatory Visit (HOSPITAL_COMMUNITY)
Admission: RE | Admit: 2021-03-15 | Discharge: 2021-03-15 | Disposition: A | Discharge status: Home / Self Care | Payer: BC Managed Care – PPO | Source: Ambulatory Visit | Attending: Medical | Admitting: Medical

## 2021-03-15 ENCOUNTER — Other Ambulatory Visit: Payer: Self-pay | Admitting: Medical

## 2021-03-15 DIAGNOSIS — Z9889 Other specified postprocedural states: Secondary | ICD-10-CM | POA: Diagnosis not present

## 2021-03-15 DIAGNOSIS — R0602 Shortness of breath: Secondary | ICD-10-CM | POA: Diagnosis not present

## 2021-03-15 DIAGNOSIS — R0789 Other chest pain: Secondary | ICD-10-CM

## 2021-03-15 LAB — NOVEL CORONAVIRUS, NAA: SARS-CoV-2, NAA: NOT DETECTED

## 2021-03-15 LAB — SARS-COV-2, NAA 2 DAY TAT

## 2021-03-15 MED ORDER — PREDNISONE 10 MG PO TABS: ORAL_TABLET | ORAL | 0 refills | Status: DC

## 2021-03-15 MED ORDER — AZITHROMYCIN 250 MG PO TABS: ORAL_TABLET | ORAL | 0 refills | Status: DC

## 2021-03-15 NOTE — Telephone Encounter (Signed)
Pt called and states he is due to go back to work tonight and is still having issues. He states that he is having trouble breathing and having chest pains. He states he has not test for COVID again since we did test that was negative. Please advise pt at [phone].

## 2021-03-15 NOTE — Telephone Encounter (Signed)
Pt was notified   Please do work note for pt

## 2021-03-21 ENCOUNTER — Encounter: Payer: Self-pay | Admitting: Medical

## 2021-03-21 ENCOUNTER — Other Ambulatory Visit: Payer: Self-pay | Admitting: Medical

## 2021-03-21 ENCOUNTER — Telehealth: Payer: Self-pay | Admitting: Medical

## 2021-03-21 ENCOUNTER — Ambulatory Visit (INDEPENDENT_AMBULATORY_CARE_PROVIDER_SITE_OTHER): Payer: BC Managed Care – PPO | Admitting: Medical

## 2021-03-21 ENCOUNTER — Other Ambulatory Visit: Payer: Self-pay

## 2021-03-21 VITALS — BP 128/84 | HR 68 | Temp 98.1°F | Wt 175.6 lb

## 2021-03-21 DIAGNOSIS — J988 Other specified respiratory disorders: Secondary | ICD-10-CM | POA: Diagnosis not present

## 2021-03-21 DIAGNOSIS — Z8249 Family history of ischemic heart disease and other diseases of the circulatory system: Secondary | ICD-10-CM

## 2021-03-21 DIAGNOSIS — R0789 Other chest pain: Secondary | ICD-10-CM | POA: Diagnosis not present

## 2021-03-21 DIAGNOSIS — Z9889 Other specified postprocedural states: Secondary | ICD-10-CM

## 2021-03-21 DIAGNOSIS — R070 Pain in throat: Secondary | ICD-10-CM

## 2021-03-21 DIAGNOSIS — R9431 Abnormal electrocardiogram [ECG] [EKG]: Secondary | ICD-10-CM

## 2021-03-21 MED ORDER — LIDOCAINE VISCOUS HCL 2 % MT SOLN: 15.0000 mL | Freq: Three times a day (TID) | OROMUCOSAL | 0 refills | Status: DC | PRN

## 2021-03-21 MED ORDER — SUCRALFATE 1 G PO TABS: 1.0000 g | ORAL_TABLET | Freq: Four times a day (QID) | ORAL | 1 refills | Status: DC

## 2021-03-21 MED ORDER — SUCRALFATE 1 GM/10ML PO SUSP: 1.0000 g | Freq: Three times a day (TID) | ORAL | 0 refills | Status: DC

## 2021-03-21 NOTE — Progress Notes (Signed)
Med change

## 2021-03-21 NOTE — Progress Notes (Signed)
Subjective:  Richard Hughes is a 50 y.o. male who presents for Chief Complaint  Patient presents with   other    F/u still having chest pains and breathing issues for 3 weeks. Had surgery on throat in 95 becoming hard to swallow      Here for c/o chest discomfort and throat pain.  We did a video visit 03/13/21 for cough, throat pain.  After last visit he ended up using zpak, prednisone, cough medication and still having ongoing symptoms.   Last visit he had fatigue, headache, sore throat, SOB, chills.   Currently not complicating of SOB, but main concern today is throat discomfort and feels a fullness in throat.  Feels like he cant get a good breath without lifting up chin and siting a certain way.   He notes hx/o lung removal and teflon reconstruction surgery of vocal cords in 1995 from prior work injury.    He also notes some left chest discomfort intermittent, not relegated to other symptoms. At times feels tingling in chest or left fingers.  No associated sweats, nausea or sob.     No other aggravating or relieving factors.    No other c/o.  Past Medical History:  Diagnosis Date   Concussion 01/2018   after fainting.   H/O skin graft    Migraine    Paralyzed vocal cords 1995   resolved   Past Surgical History:  Procedure Laterality Date   LARYNX SURGERY     with teflon reconstruction s/p prior trauma, work injury   total lung removal Left    1995   Current Outpatient Medications on File Prior to Visit  Medication Sig Dispense Refill   albuterol (VENTOLIN HFA) 108 (90 Base) MCG/ACT inhaler Inhale 2 puffs into the lungs every 6 (six) hours as needed for wheezing or shortness of breath. 8 g 1   metoprolol succinate (TOPROL-XL) 25 MG 24 hr tablet Take 1 tablet (25 mg total) by mouth daily. 90 tablet 1   Multiple Vitamins-Minerals (EMERGEN-C IMMUNE PLUS) PACK Take 1 tablet by mouth 2 (two) times daily. 10 each 0   SUMAtriptan (IMITREX) 6 MG/0.5ML SOLN injection Inject 0.5 mLs (6 mg  total) into the skin every 2 (two) hours as needed for migraine or headache. May repeat in 2 hours if headache persists or recurs. 3 mL 2   azithromycin (ZITHROMAX) 250 MG tablet 2 tablets day 1, then 1 tablet days 2-4 (Patient not taking: Reported on 03/21/2021) 6 tablet 0   predniSONE (DELTASONE) 10 MG tablet 6 tablets day 1, 5 tablets day 2, 4 tablets day 3, 3 tablets day 4, 2 tablets day 5, 1 tablet day 6 (Patient not taking: Reported on 03/21/2021) 21 tablet 0   No current facility-administered medications on file prior to visit.     The following portions of the patient's history were reviewed and updated as appropriate: allergies, current medications, past family history, past medical history, past social history, past surgical history and problem list.  ROS Otherwise as in subjective above  Objective: BP 128/84   Pulse 68   Temp 98.1 F (36.7 C)   Wt 175 lb 9.6 oz (79.7 kg)   SpO2 97%   BMI 29.22 kg/m   General appearance: alert, no distress, well developed, well nourished, sitting with chin somewhat elevated, seems to have some neck discomfort HEENT: normocephalic, sclerae anicteric, conjunctiva pink and moist, TMs pearly, nares patent, no discharge or erythema, pharynx normal Oral cavity: MMM, no lesions Neck:  supple, but tender over lower anterior neck over left side of larynx and over thyroid area, otherwise no lymphadenopathy, no thyromegaly, no masses Heart: RRR, normal S1, S2, no murmurs Lungs: decreased left lung sounds, otherwise, no wheezes, rhonchi, or rales Pulses: 2+ radial pulses, 2+ pedal pulses, normal cap refill Ext: no edema  EKG Indication, chest discomfort.   Rate 65 bpm, pr 143m, QRS 730m QTC [id number] 34 degrees, T wave inversions V5 and V6, consider lateral ischemia    Chest xray 03/15/21 FINDINGS: Chronic opacification of left thorax presumably related to history of lung removal. Hyperinflated right lung without focal opacity, pleural effusion  or pneumothorax. Mediastinal shift to the left with obscured cardiomediastinal silhouette. Aortic atherosclerosis.   IMPRESSION: Grossly stable radiographic appearance of the chest since 2007 with chronic findings on left.      Assessment: Encounter Diagnoses  Name Primary?   Respiratory tract infection Yes   Chest discomfort    Throat pain    History of laryngoplasty    History of lung surgery    Abnormal EKG    Family history of heart disease      Plan: Discussed symptoms and concerns.  EKG reviewed.    Discussed case with supervising physician Dr. LalRedmond SchoolHis symptoms and exam are in usual.  We will use oral Benadryl over-the-counter which she has at home twice daily for the next 4 to 5 days or up to 3 times daily as well as not too sedating.  Begin Xylocaine solution up to 3 times a day liquid in the mouth as well as sucralfate for the short-term next few days.  Rest, hydrate well.  If not much improved in the next few days then call back.  Given the unusual throat and swallowing symptoms we will refer to ear nose and throat at his request  EKG abnormal today.  Given the recent chest discomfort which may just be inflammation from recent illness but given the EKG findings that are new from 2019 EKG, referral to cardiology for consult  JesAndhys seen today for other.  Diagnoses and all orders for this visit:  Respiratory tract infection  Chest discomfort -     EKG 12-Lead -     Basic metabolic panel  Throat pain -     CBC with Differential/Platelet -     TSH + free T4 -     Basic metabolic panel -     Ambulatory referral to ENT  History of laryngoplasty -     Ambulatory referral to ENT  History of lung surgery  Abnormal EKG -     Ambulatory referral to Cardiology  Family history of heart disease -     Ambulatory referral to Cardiology  Other orders -     sucralfate (CARAFATE) 1 GM/10ML suspension; Take 10 mLs (1 g total) by mouth 4 (four) times daily  -  with meals and at bedtime. -     lidocaine (XYLOCAINE) 2 % solution; Use as directed 15 mLs in the mouth or throat 3 (three) times daily as needed for mouth pain.   Follow up: pending labs

## 2021-03-21 NOTE — Telephone Encounter (Signed)
error 

## 2021-03-21 NOTE — Telephone Encounter (Signed)
Pt left message on my chart and I called him and states he is having chest discomfort when he breathes in and out, and is stil having a sore throat, and today is his last day of his steroids, he wants to know if he needs to come in or his there something else he can take, Pt can be reached at [phone]

## 2021-03-22 ENCOUNTER — Ambulatory Visit: Payer: BC Managed Care – PPO | Admitting: Medical

## 2021-03-22 ENCOUNTER — Other Ambulatory Visit: Payer: Self-pay | Admitting: Medical

## 2021-03-22 LAB — TSH+FREE T4
Free T4: 1.4 ng/dL (ref 0.82–1.77)
TSH: 1.85 u[IU]/mL (ref 0.450–4.500)

## 2021-03-22 LAB — CBC WITH DIFFERENTIAL/PLATELET
Basophils Absolute: 0.1 10*3/uL (ref 0.0–0.2)
Basos: 0 %
EOS (ABSOLUTE): 0 10*3/uL (ref 0.0–0.4)
Eos: 0 %
Hematocrit: 45.4 % (ref 37.5–51.0)
Hemoglobin: 16 g/dL (ref 13.0–17.7)
Immature Grans (Abs): 0.1 10*3/uL (ref 0.0–0.1)
Immature Granulocytes: 1 %
Lymphocytes Absolute: 1.3 10*3/uL (ref 0.7–3.1)
Lymphs: 11 %
MCH: 32.2 pg (ref 26.6–33.0)
MCHC: 35.2 g/dL (ref 31.5–35.7)
MCV: 91 fL (ref 79–97)
Monocytes Absolute: 0.7 10*3/uL (ref 0.1–0.9)
Monocytes: 5 %
Neutrophils Absolute: 10 10*3/uL — ABNORMAL HIGH (ref 1.4–7.0)
Neutrophils: 83 %
Platelets: 296 10*3/uL (ref 150–450)
RBC: 4.97 x10E6/uL (ref 4.14–5.80)
RDW: 11.9 % (ref 11.6–15.4)
WBC: 12.1 10*3/uL — ABNORMAL HIGH (ref 3.4–10.8)

## 2021-03-22 LAB — BASIC METABOLIC PANEL
BUN/Creatinine Ratio: 12 (ref 9–20)
BUN: 14 mg/dL (ref 6–24)
CO2: 26 mmol/L (ref 20–29)
Calcium: 9.8 mg/dL (ref 8.7–10.2)
Chloride: 96 mmol/L (ref 96–106)
Creatinine, Ser: 1.14 mg/dL (ref 0.76–1.27)
Glucose: 75 mg/dL (ref 65–99)
Potassium: 4.7 mmol/L (ref 3.5–5.2)
Sodium: 139 mmol/L (ref 134–144)
eGFR: 79 mL/min/{1.73_m2} (ref >59)

## 2021-03-22 MED ORDER — AMOXICILLIN 500 MG PO TABS: 500.0000 mg | ORAL_TABLET | Freq: Three times a day (TID) | ORAL | 0 refills | Status: DC

## 2021-03-22 MED ORDER — FLUCONAZOLE 150 MG PO TABS: 150.0000 mg | ORAL_TABLET | ORAL | 0 refills | Status: DC

## 2021-03-23 ENCOUNTER — Telehealth: Payer: Self-pay | Admitting: Internal Medicine

## 2021-03-23 NOTE — Telephone Encounter (Signed)
Pt is needing a note to return to work saying he can return with no restrictions. Please advise. Also he wanted to make sure you got some forms from [facility]

## 2021-03-28 DIAGNOSIS — Z0289 Encounter for other administrative examinations: Secondary | ICD-10-CM

## 2021-03-28 NOTE — Telephone Encounter (Signed)
DONE

## 2021-03-28 NOTE — Telephone Encounter (Signed)
Return to work letter written and called pt and informed

## 2021-05-11 ENCOUNTER — Ambulatory Visit (INDEPENDENT_AMBULATORY_CARE_PROVIDER_SITE_OTHER): Payer: BC Managed Care – PPO | Admitting: Internal Medicine

## 2021-05-11 ENCOUNTER — Encounter: Payer: Self-pay | Admitting: Internal Medicine

## 2021-05-11 ENCOUNTER — Other Ambulatory Visit: Payer: Self-pay

## 2021-05-11 VITALS — BP 116/80 | HR 58 | Ht 65.0 in | Wt 172.4 lb

## 2021-05-11 DIAGNOSIS — R072 Precordial pain: Secondary | ICD-10-CM | POA: Diagnosis not present

## 2021-05-11 DIAGNOSIS — R0602 Shortness of breath: Secondary | ICD-10-CM | POA: Diagnosis not present

## 2021-05-11 LAB — BASIC METABOLIC PANEL
BUN/Creatinine Ratio: 14 (ref 9–20)
BUN: 16 mg/dL (ref 6–24)
CO2: 27 mmol/L (ref 20–29)
Calcium: 9.5 mg/dL (ref 8.7–10.2)
Chloride: 100 mmol/L (ref 96–106)
Creatinine, Ser: 1.12 mg/dL (ref 0.76–1.27)
Glucose: 76 mg/dL (ref 70–99)
Potassium: 5 mmol/L (ref 3.5–5.2)
Sodium: 139 mmol/L (ref 134–144)
eGFR: 81 mL/min/{1.73_m2} (ref >59)

## 2021-05-11 NOTE — Patient Instructions (Addendum)
Medication Instructions:  No Changes In Medications at this time.  *If you need a refill on your cardiac medications before your next appointment, please call your pharmacy*  Lab Work: BMET-TODAY  If you have labs (blood work) drawn today and your tests are completely normal, you will receive your results only by: [place] (if you have MyChart) OR A paper copy in the mail If you have any lab test that is abnormal or we need to change your treatment, we will call you to review the results.  Testing/Procedures: Your physician has requested that you have cardiac CT PLEASE SCHEDULE THIS FOR November 3rd. Cardiac computed tomography (CT) is a painless test that uses an x-ray machine to take clear, detailed pictures of your heart. For further information please visit [facility]. Please follow instruction sheet as given.  Your physician has requested that you have an echocardiogram. Echocardiography is a painless test that uses sound waves to create images of your heart. It provides your doctor with information about the size and shape of your heart and how well your heart's chambers and valves are working. You may receive an ultrasound enhancing agent through an IV if needed to better visualize your heart during the echo.This procedure takes approximately one hour. There are no restrictions for this procedure. This will take place at the [address].   Follow-Up: At [facility], you and your health needs are our priority.  As part of our continuing mission to provide you with exceptional heart care, we have created designated Provider Care Teams.  These Care Teams include your primary Cardiologist (physician) and Advanced Practice Providers (APPs -  Physician Assistants and Nurse Practitioners) who all work together to provide you with the care you need, when you need it.  Your next appointment:   AFTER TESTING   The format for your next appointment:   In  Person  Provider:   Cherlynn Kaiser, MD  Other Instructions   Your cardiac CT will be scheduled at one of the below locations:   [facility] [address] [place], [place] [address] [phone]  If scheduled at [facility], please arrive at the [facility] main entrance (entrance A) of [facility] 30 minutes prior to test start time. You can use the FREE valet parking offered at the main entrance (encouraged to control the heart rate for the test) Proceed to the [facility] Radiology Department (first floor) to check-in and test prep.  Please follow these instructions carefully (unless otherwise directed):  Hold all erectile dysfunction medications at least 3 days (72 hrs) prior to test.  On the Night Before the Test: Be sure to Drink plenty of water. Do not consume any caffeinated/decaffeinated beverages or chocolate 12 hours prior to your test. Do not take any antihistamines 12 hours prior to your test. If the patient has contrast allergy:  On the Day of the Test: Drink plenty of water until 1 hour prior to the test. Do not eat any food 4 hours prior to the test. You may take your regular medications prior to the test.  HOLD Furosemide/Hydrochlorothiazide morning of the test.      After the Test: Drink plenty of water. After receiving IV contrast, you may experience a mild flushed feeling. This is normal. On occasion, you may experience a mild rash up to 24 hours after the test. This is not dangerous. If this occurs, you can take Benadryl 25 mg and increase your  fluid intake. If you experience trouble breathing, this can be serious. If it is severe call 911 IMMEDIATELY. If it is mild, please call our office. If you take any of these medications: Glipizide/Metformin, Avandament, Glucavance, please do not take 48 hours after completing test unless otherwise instructed.  Please allow 2-4 weeks for scheduling of routine cardiac CTs. Some  insurance companies require a pre-authorization which may delay scheduling of this test.   For non-scheduling related questions, please contact the cardiac imaging nurse navigator should you have any questions/concerns: Marchia Bond, Cardiac Imaging Nurse Navigator Gordy Clement, Cardiac Imaging Nurse Navigator [facility] Direct Office Dial: [phone]   For scheduling needs, including cancellations and rescheduling, please call [place], [phone].

## 2021-05-11 NOTE — Progress Notes (Signed)
Cardiology Office Note:    Date:  05/11/2021   ID:  Richard Hughes, DOB 1971/01/11, MRN [id number]  PCP:  Carlena Hurl, PA-C  Cardiologist:  None  Electrophysiologist:  None   Referring MD: Carlena Hurl, PA-C   Chief Complaint/Reason for Referral: Chest pain  History of Present Illness:    Richard Hughes is a 50 y.o. male with a history of concussion, migraines, and L pneumonectomy post trauma . Here for evaluation of chest pain, abnormal EKG and family history of heart disease.   He saw his PCP, Dorothea Ogle PA on 03/21/21 for a respiratory tract infection. ECG at that visit showed ST T wave abnormality laterally. Not seen on ECG today but we discussed this finding in detail.  Today, he complains of L chest pain. He describes the pain as little needles and discomfort. The pain began over a year ago and he feels it multiple times a day. He initially thought the pain was gas build-up and does not attribute it to muscle strain. However, palpation worsens the pain. He feels the pain more when laying down and it does not always go away when getting back up. His most recent episode occurred today when he was walking up the stairs. 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 The pain has gotten progressively worse. He has a history of rib fractures which may be related to the chest pain.   6 months ago, he started experiencing shortness of breath with exertion. These SOB episodes have gotten worse over time and with walking. He does not reports getting shortness of breath while resting. He had reconstructed vocal chords post trauma believes his shortness of breath may be related to his operation to repair his vocal chords or his lung operation.   He occasionally experiences LLE discomfort. He describes this sensations "like there's no circulation" and only feels it when lying down. He needs to continuously move his L leg to  relieve this discomfort. Very similar to his post surgical pain.   He used to occasionally smoke but does not smoke currently. He limits his alcohol consumption to once a month. For exercise, he enjoys lifting weights.  His grandfather, father, and some uncles all had heart issues while in their 69s. His grandfather did have a heart attack.  The patient denies dyspnea at rest, PND, orthopnea, or leg swelling. Denies cough, fever, chills, nausea, or vomiting. Denies syncope, presyncope, or snoring. Denies dizziness or lightheadedness.   Past Medical History:  Diagnosis Date   Concussion 01/2018   after fainting.   H/O skin graft    Migraine    Paralyzed vocal cords 1995   resolved    Past Surgical History:  Procedure Laterality Date   LARYNX SURGERY     with teflon reconstruction s/p prior trauma, work injury   total lung removal Left    1995    Current Medications: Current Meds  Medication Sig   SUMAtriptan (IMITREX) 6 MG/0.5ML SOLN injection Inject 0.5 mLs (6 mg total) into the skin every 2 (two) hours as needed for migraine or headache. May repeat in 2 hours if headache persists or recurs.     Allergies:   Fentanyl   Social History   Tobacco Use   Smoking status: Never   Smokeless tobacco: Never  Substance Use Topics   Alcohol use: Yes    Comment: Socailly    Drug use:  No     Family History: The patient's family history includes Cancer in his maternal grandmother; Heart attack in his mother; Heart disease in his father; Hyperlipidemia in his father; Hypertension in his father.  ROS:   Please see the history of present illness.    (+) L Chest Pain (+) Dyspnea (exertion) (+) LLE Discomfort All other systems reviewed and are negative.  EKGs/Labs/Other Studies Reviewed:    The following studies were reviewed today: No previous cardiovascular studies  EKG:  05/11/21: Sinus bradycardia, rate 58 bpm, normal ST T wave segments  Imaging studies that I have  independently reviewed today: CXR 03/15/21  Recent Labs: 03/21/2021: BUN 14; Creatinine, Ser 1.14; Hemoglobin 16.0; Platelets 296; Potassium 4.7; Sodium 139; TSH 1.850  Recent Lipid Panel No results found for: CHOL, TRIG, HDL, CHOLHDL, VLDL, LDLCALC, LDLDIRECT  Physical Exam:    VS:  BP 116/80 (BP Location: Right Arm)   Pulse (!) 58   Ht 5\' 5"  (1.651 m)   Wt 172 lb 6.4 oz (78.2 kg)   SpO2 99%   BMI 28.69 kg/m     Wt Readings from Last 5 Encounters:  05/11/21 172 lb 6.4 oz (78.2 kg)  03/21/21 175 lb 9.6 oz (79.7 kg)  03/13/21 170 lb (77.1 kg)  07/22/20 175 lb (79.4 kg)  03/09/20 175 lb (79.4 kg)    Constitutional: No acute distress Eyes: sclera non-icteric, normal conjunctiva and lids ENMT: normal dentition, moist mucous membranes Cardiovascular: regular rhythm, normal rate, no murmur. S1 and S2 normal. No jugular venous distention.  Respiratory: clear to auscultation on right, absent sounds on left GI : normal bowel sounds, soft and nontender. No distention.   MSK: extremities warm, well perfused. No edema.  NEURO: grossly nonfocal exam, moves all extremities. PSYCH: alert and oriented x 3, normal mood and affect.   ASSESSMENT:    1. Precordial pain   2. Shortness of breath    PLAN:    Precordial pain - Plan: CT CORONARY MORPH W/CTA COR W/SCORE W/CA W/CM &/OR WO/CM, ECHOCARDIOGRAM COMPLETE, Basic metabolic panel  Shortness of breath - Plan: ECHOCARDIOGRAM COMPLETE, Basic metabolic panel  Will obtain an echo for SOB to ensure that the positional component is not related to pericardial disease. He is young, unlikely to be related to valvular heart disease or structural however we will evaluate.   CCTA to assess for coronary disease given age and Fhx of premature CAD. With left lung removal, suspect he will have very atypical cardiac imaging windows and may have leftward cardiac shift. This will need to be accounted for when setting ROI and z axis.  Follow up after testing.    Total time of encounter: 60 minutes total time of encounter, including 30 minutes spent in face-to-face patient care on the date of this encounter. This time includes coordination of care and counseling regarding above mentioned problem list. Remainder of non-face-to-face time involved reviewing chart documents/testing relevant to the patient encounter and documentation in the medical record. I have independently reviewed documentation from referring provider.   Cherlynn Kaiser, MD, [place]   Shared Decision Making/Informed Consent:       Medication Adjustments/Labs and Tests Ordered: Current medicines are reviewed at length with the patient today.  Concerns regarding medicines are outlined above.   Orders Placed This Encounter  Procedures   CT CORONARY MORPH W/CTA COR W/SCORE W/CA W/CM &/OR WO/CM   Basic metabolic panel   ECHOCARDIOGRAM COMPLETE     No  orders of the defined types were placed in this encounter.   Patient Instructions  Medication Instructions:  No Changes In Medications at this time.  *If you need a refill on your cardiac medications before your next appointment, please call your pharmacy*  Lab Work: BMET-TODAY  If you have labs (blood work) drawn today and your tests are completely normal, you will receive your results only by: [place] (if you have MyChart) OR A paper copy in the mail If you have any lab test that is abnormal or we need to change your treatment, we will call you to review the results.  Testing/Procedures: Your physician has requested that you have cardiac CT PLEASE SCHEDULE THIS FOR November 3rd. Cardiac computed tomography (CT) is a painless test that uses an x-ray machine to take clear, detailed pictures of your heart. For further information please visit [facility][email or web address]. Please follow instruction sheet as given.  Your physician has requested that you have an echocardiogram. Echocardiography is a  painless test that uses sound waves to create images of your heart. It provides your doctor with information about the size and shape of your heart and how well your heart's chambers and valves are working. You may receive an ultrasound enhancing agent through an IV if needed to better visualize your heart during the echo.This procedure takes approximately one hour. There are no restrictions for this procedure. This will take place at the [address], [address].   Follow-Up: At [facility], you and your health needs are our priority.  As part of our continuing mission to provide you with exceptional heart care, we have created designated Provider Care Teams.  These Care Teams include your primary Cardiologist (physician) and Advanced Practice Providers (APPs -  Physician Assistants and Nurse Practitioners) who all work together to provide you with the care you need, when you need it.  Your next appointment:   AFTER TESTING   The format for your next appointment:   In Person  Provider:   Cherlynn Kaiser, MD  Other Instructions   Your cardiac CT will be scheduled at one of the below locations:   [facility] [address] [place], [place] [address] [phone]  If scheduled at [facility], please arrive at the [facility] main entrance (entrance A) of [facility] 30 minutes prior to test start time. You can use the FREE valet parking offered at the main entrance (encouraged to control the heart rate for the test) Proceed to the [facility] Radiology Department (first floor) to check-in and test prep.  Please follow these instructions carefully (unless otherwise directed):  Hold all erectile dysfunction medications at least 3 days (72 hrs) prior to test.  On the Night Before the Test: Be sure to Drink plenty of water. Do not consume any caffeinated/decaffeinated beverages or chocolate 12 hours prior to your test. Do not take any antihistamines 12  hours prior to your test. If the patient has contrast allergy:  On the Day of the Test: Drink plenty of water until 1 hour prior to the test. Do not eat any food 4 hours prior to the test. You may take your regular medications prior to the test.  HOLD Furosemide/Hydrochlorothiazide morning of the test.      After the Test: Drink plenty of water. After receiving IV contrast, you may experience a mild flushed feeling. This is normal. On occasion, you may experience a mild rash up to 24 hours after the test.  This is not dangerous. If this occurs, you can take Benadryl 25 mg and increase your fluid intake. If you experience trouble breathing, this can be serious. If it is severe call 911 IMMEDIATELY. If it is mild, please call our office. If you take any of these medications: Glipizide/Metformin, Avandament, Glucavance, please do not take 48 hours after completing test unless otherwise instructed.  Please allow 2-4 weeks for scheduling of routine cardiac CTs. Some insurance companies require a pre-authorization which may delay scheduling of this test.   For non-scheduling related questions, please contact the cardiac imaging nurse navigator should you have any questions/concerns: Marchia Bond, Cardiac Imaging Nurse Navigator Gordy Clement, Cardiac Imaging Nurse Navigator [facility] Direct Office Dial: [phone]   For scheduling needs, including cancellations and rescheduling, please call [place], [phone].    I,Mykaella Javier,acting as a scribe for Elouise Munroe, MD.,have documented all relevant documentation on the behalf of Elouise Munroe, MD,as directed by  Elouise Munroe, MD while in the presence of Elouise Munroe, MD.  I, Elouise Munroe, MD, have reviewed all documentation for this visit. The documentation on today's date of service for the exam, diagnosis, procedures, and orders are all accurate and complete.

## 2021-05-16 ENCOUNTER — Other Ambulatory Visit: Payer: Self-pay

## 2021-05-16 ENCOUNTER — Telehealth (HOSPITAL_COMMUNITY): Payer: Self-pay | Admitting: Emergency Medicine

## 2021-05-16 ENCOUNTER — Ambulatory Visit (HOSPITAL_COMMUNITY): Payer: BC Managed Care – PPO | Attending: Cardiology

## 2021-05-16 DIAGNOSIS — R072 Precordial pain: Secondary | ICD-10-CM | POA: Insufficient documentation

## 2021-05-16 DIAGNOSIS — R0602 Shortness of breath: Secondary | ICD-10-CM | POA: Insufficient documentation

## 2021-05-16 LAB — ECHOCARDIOGRAM COMPLETE
Area-P 1/2: 5.66 cm2
S' Lateral: 3.1 cm

## 2021-05-16 MED ORDER — PERFLUTREN LIPID MICROSPHERE
1.0000 mL | INTRAVENOUS | Status: AC | PRN
  Administered 2021-05-16: 1 mL via INTRAVENOUS

## 2021-05-16 NOTE — Telephone Encounter (Signed)
Attempted to call patient regarding upcoming cardiac CT appointment. °Left message on voicemail with name and callback number °Tanajah Boulter RN Navigator Cardiac Imaging °[facility] °[phone] Office °[phone] Cell ° °

## 2021-05-16 NOTE — Addendum Note (Signed)
Addended by: Linton Ham on: 05/16/2021 03:03 PM   Modules accepted: Orders

## 2021-05-18 ENCOUNTER — Ambulatory Visit (HOSPITAL_COMMUNITY)
Admission: RE | Admit: 2021-05-18 | Discharge: 2021-05-18 | Disposition: A | Discharge status: Home / Self Care | Payer: BC Managed Care – PPO | Source: Ambulatory Visit | Attending: Internal Medicine | Admitting: Internal Medicine

## 2021-05-18 ENCOUNTER — Other Ambulatory Visit: Payer: Self-pay

## 2021-05-18 DIAGNOSIS — R072 Precordial pain: Secondary | ICD-10-CM

## 2021-05-18 MED ORDER — NITROGLYCERIN 0.4 MG SL SUBL
0.8000 mg | SUBLINGUAL_TABLET | Freq: Once | SUBLINGUAL | Status: AC
  Administered 2021-05-18: 0.8 mg via SUBLINGUAL

## 2021-05-18 MED ORDER — DILTIAZEM HCL 25 MG/5ML IV SOLN
5.0000 mg | INTRAVENOUS | Status: DC | PRN
Start: 2021-05-18 — End: 2021-05-19

## 2021-05-18 MED ORDER — IOHEXOL 350 MG/ML SOLN
100.0000 mL | Freq: Once | INTRAVENOUS | Status: AC | PRN
  Administered 2021-05-18: 95 mL via INTRAVENOUS

## 2021-05-18 MED ORDER — METOPROLOL TARTRATE 5 MG/5ML IV SOLN: 5.0000 mg | INTRAVENOUS | Status: DC | PRN

## 2021-05-18 MED ORDER — METOPROLOL TARTRATE 5 MG/5ML IV SOLN
INTRAVENOUS | Status: AC
  Filled 2021-05-18: qty 10

## 2021-05-18 MED ORDER — NITROGLYCERIN 0.4 MG SL SUBL
SUBLINGUAL_TABLET | SUBLINGUAL | Status: AC
  Filled 2021-05-18: qty 2

## 2021-06-10 NOTE — Progress Notes (Signed)
Cardiology Office Note:    Date:  06/13/2021   ID:  Richard Hughes, DOB [birth year], MRN [id number]  PCP:  Richard Hurl, PA-C  Cardiologist:  Richard Munroe, MD  Electrophysiologist:  None   Referring MD: Richard Hurl, PA-C   Chief Complaint: follow-up of chest pain and shortness of breath  History of Present Illness:    Richard Hughes is a 50 y.o. male with a history of chest pain with minimal CAD noted on coronary CTA on 05/18/2021, migraines, concussion, and left total pneumonectomy in 1995 after trauma who is followed by Dr. Margaretann Loveless and presents today for follow-up of chest pain and shortness of breath.  Patient was referred to Dr. Margaretann Loveless on 05/11/2021 for further evaluation of left sided chest pain that he reported was worse when laying down (but did not always go away when sitting up) and worse with palpation. He reported pain felt similar to post-surgical pain that he had after his pneumonectomy in 1995. He also reported some dyspnea on exertion and left lower extremity discomfort. Coronary CTA and Echo were ordered for further evaluation. Echo showed LVEF of 60-65% with normal diastolic function and a small pericardial effusion. No significant valvular disease. Coronary CTA showed a coronary calcium score of 2 (86th percentile for age and sex) and only minimal CAD. Therefore, chest pain and shortness of breath not felt to be cardiac in nature.  Patient presents today for follow-up. Here alone. Reviewed Echo and coronary CTA results with patient. He continues to have some atypical left sided chest pain that he describes as a "numbness" and "crawling sensation" that is worse when laying down. This will last for a couple of minutes and then resolve on its own. He also reports continued dyspnea on exertion when walking longer distances or walking upstairs. He states he started to notice more heavy breathing about 1 year ago. No shortness of breath at rest. No orthopnea or PND.  No lower extremity edema. He does report an episode of dizziness 2 days ago that occurred after standing suddenly. He noted dizziness, near syncope, and a little clamminess but denies any syncope. He states he was probably dehydrated at that time. He does report syncopal episode in summer of 2021 that again occurred after standing suddenly and was preceded by dizziness and clamminess. He denies any syncope since that time. Both of these episodes sound like orthostatic hypotension. Patient denies any palpitations. He also reports some numbness down his left leg. He states he has some lower back issues from his prior trauma and numbness seems to radiate down left. No claudication. This sounds like sciatica.   Past Medical History:  Diagnosis Date   Concussion 01/2018   after fainting.   H/O skin graft    Migraine    Paralyzed vocal cords 1995   resolved   Status post pneumonectomy     Past Surgical History:  Procedure Laterality Date   LARYNX SURGERY     with teflon reconstruction s/p prior trauma, work injury   total lung removal Left    1995    Current Medications: Current Meds  Medication Sig   albuterol (VENTOLIN HFA) 108 (90 Base) MCG/ACT inhaler Inhale 2 puffs into the lungs every 6 (six) hours as needed for wheezing or shortness of breath.   [DISCONTINUED] metoprolol succinate (TOPROL-XL) 25 MG 24 hr tablet Take 1 tablet (25 mg total) by mouth daily.   [DISCONTINUED] Multiple Vitamins-Minerals (EMERGEN-C IMMUNE PLUS) PACK Take 1 tablet  by mouth 2 (two) times daily.   [DISCONTINUED] sucralfate (CARAFATE) 1 g tablet Take 1 tablet (1 g total) by mouth 4 (four) times daily.   [DISCONTINUED] SUMAtriptan (IMITREX) 6 MG/0.5ML SOLN injection Inject 0.5 mLs (6 mg total) into the skin every 2 (two) hours as needed for migraine or headache. May repeat in 2 hours if headache persists or recurs.     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  ROS:   Please see the history of present illness.     EKGs/Labs/Other Studies Reviewed:    The following studies were reviewed today:  Echocardiogram 05/17/2021: Impressions: 1. Left ventricular ejection fraction, by estimation, is 60 to 65%. The  left ventricle has normal function. Left ventricular endocardial border  not optimally defined to evaluate regional wall motion, however wall  motion appears grossly normal. Left  ventricular diastolic parameters were normal.   2. Right ventricular systolic function is normal. The right ventricular  size is normal. There is normal pulmonary artery systolic pressure. The  estimated right ventricular systolic pressure is 83.6 mmHg.   3. A small pericardial effusion is present. The pericardial effusion is  posterior to the left ventricle.   4. The mitral valve is normal in structure. Trivial mitral valve  regurgitation. No  evidence of mitral stenosis.   5. The aortic valve is tricuspid. Aortic valve regurgitation is trivial.  No aortic stenosis is present.   6. The inferior vena cava is normal in size with greater than 50%  respiratory variability, suggesting right atrial pressure of 3 mmHg.  _______________  Coronary CTA 05/18/2021: Impression: 1. Minimal CAD, CADRADS = 1. 2. Coronary calcium score is 2, which places the patient in the 86th percentile for age and sex matched control. 3. Normal coronary origins with left dominance.  EKG:  EKG not ordered today.   Recent Labs: 03/21/2021: Hemoglobin 16.0; Platelets 296; TSH 1.850 05/11/2021: BUN 16; Creatinine, Ser 1.12; Potassium 5.0; Sodium 139  Recent Lipid Panel No results found for: CHOL, TRIG, HDL, CHOLHDL, VLDL, LDLCALC, LDLDIRECT  Physical Exam:    Vital Signs: BP 138/70 (BP Location: Right Arm, Patient Position: Sitting, Cuff Size: Normal)   Pulse 77   Resp 20   Ht 5\' 5"  (  1.651 m)   Wt 173 lb 3.2 oz (78.6 kg)   SpO2 97%   BMI 28.82 kg/m     Wt Readings from Last 3 Encounters:  06/13/21 173 lb 3.2 oz (78.6 kg)  05/11/21 172 lb 6.4 oz (78.2 kg)  03/21/21 175 lb 9.6 oz (79.7 kg)     General: 50 y.o. African-American male in no acute distress. HEENT: Normocephalic and atraumatic. Sclera clear.  Neck: Supple. No carotid bruits. No JVD. Heart: RRR. Distinct S1 and S2. No murmurs, gallops, or rubs. Radial and distal pedal pulses 2+ and equal bilaterally. Lungs: No increased work of breathing. Clear to ausculation on the right. Absent lung sounds on the left (s/p pneumonectomy) Abdomen: Soft, non-distended, and non-tender to palpation.  Extremities: No lower extremity edema.    Skin: Warm and dry. Neuro: Alert and oriented x3. No focal deficits. Psych: Normal affect. Responds appropriately.  Assessment:    1. Chest pain of uncertain etiology   2. Shortness of breath   3. Dizziness   4. Near syncope   5. Elevated BP without diagnosis  of hypertension     Plan:    Chest Pain Shortness of Breath Patient reported atypical chest pain and shortness of breath at last visit. Cardiac work-up was reassuring. Echo showed LVEF of 60-65% with normal diastolic function and a small pericardial effusion but no significant valvular disease. Coronary CTA showed coronary calcium score of 2 (86th percentile for age and sex) and only minimal CAD.  - Patient continues to report some atypical chest pain and dyspnea with exertion. Reviewed results of recent coronary CTA and Echo. Given these results, do not think symptoms are cardiac in nature. May be secondary to prior left pneumonectomy. No additional cardiac work-up necessary. Recommended following up with PCP.  Dizziness/ Near Syncope  History of Syncope Patient described an episode of dizziness and near syncope a couple of days ago that occurred after standing suddenly. Also reports a syncopal episode back in summer of 2021 - again this was after standing suddenly and was preceded by dizziness and clamminess. No other syncope since that time  - Recommended staying well hydrated and changing positions slowly. Also discussed that compression stockings can be helpful. - Both of the above episodes sound very consistent with orthostasis. Recent coronary CTA and Echo were reassuring. Will hold off on monitor for now given these episodes have only occurred rarely. Advised patient to let us know if this occurs more frequently at which time would consider ordering a monitor.  Elevated BP without Diagnosis of Hypertension BP initially elevated in the office at 155/77 but came down on repeat checks. Systolic BP on final recheck at the end of visit was in the 120s. - Patient previously on Toprol 25mg  daily although he is no longer taking this and cannot tell me why he is on this. - Will hold off on starting any medications at this time. Advised patient to monitor BP at home and notify us if BP consistently  > 130/80.  Disposition: Follow up in 1 year.    Medication Adjustments/Labs and Tests Ordered: Current medicines are reviewed at length with the patient today.  Concerns regarding medicines are outlined above.  No orders of the defined types were placed in this encounter.  No orders of the defined types were placed in this encounter.   Patient Instructions  Medication Instructions:  Your physician recommends that you continue on your current medications as directed. Please refer to the Current Medication list  given to you today.  *If you need a refill on your cardiac medications before your next appointment, please call your pharmacy*  Lab Work: NONE ordered at this time of appointment   If you have labs (blood work) drawn today and your tests are completely normal, you will receive your results only by: [place] (if you have MyChart) OR A paper copy in the mail If you have any lab test that is abnormal or we need to change your treatment, we will call you to review the results.  Testing/Procedures: NONE ordered at this time of appointment   Follow-Up: At [facility], you and your health needs are our priority.  As part of our continuing mission to provide you with exceptional heart care, we have created designated Provider Care Teams.  These Care Teams include your primary Cardiologist (physician) and Advanced Practice Providers (APPs -  Physician Assistants and Nurse Practitioners) who all work together to provide you with the care you need, when you need it.  Your next appointment:   1 year(s)  The format for your next appointment:   In Person  Provider:   Elouise Munroe, MD    Other Instructions    Signed, Darreld Mclean, PA-C  06/13/2021 4:17 PM    [place]

## 2021-06-11 ENCOUNTER — Encounter: Payer: Self-pay | Admitting: Student

## 2021-06-11 DIAGNOSIS — G43909 Migraine, unspecified, not intractable, without status migrainosus: Secondary | ICD-10-CM | POA: Insufficient documentation

## 2021-06-11 DIAGNOSIS — Z902 Acquired absence of lung [part of]: Secondary | ICD-10-CM | POA: Insufficient documentation

## 2021-06-13 ENCOUNTER — Encounter: Payer: Self-pay | Admitting: Student

## 2021-06-13 ENCOUNTER — Ambulatory Visit (INDEPENDENT_AMBULATORY_CARE_PROVIDER_SITE_OTHER): Payer: BC Managed Care – PPO | Admitting: Student

## 2021-06-13 ENCOUNTER — Other Ambulatory Visit: Payer: Self-pay

## 2021-06-13 VITALS — BP 138/70 | HR 77 | Resp 20 | Ht 65.0 in | Wt 173.2 lb

## 2021-06-13 DIAGNOSIS — R42 Dizziness and giddiness: Secondary | ICD-10-CM

## 2021-06-13 DIAGNOSIS — R0602 Shortness of breath: Secondary | ICD-10-CM | POA: Diagnosis not present

## 2021-06-13 DIAGNOSIS — R55 Syncope and collapse: Secondary | ICD-10-CM

## 2021-06-13 DIAGNOSIS — R079 Chest pain, unspecified: Secondary | ICD-10-CM

## 2021-06-13 DIAGNOSIS — R030 Elevated blood-pressure reading, without diagnosis of hypertension: Secondary | ICD-10-CM

## 2021-06-13 NOTE — Patient Instructions (Addendum)
Medication Instructions:  Your physician recommends that you continue on your current medications as directed. Please refer to the Current Medication list given to you today.  *If you need a refill on your cardiac medications before your next appointment, please call your pharmacy*  Lab Work: NONE ordered at this time of appointment   If you have labs (blood work) drawn today and your tests are completely normal, you will receive your results only by: [place] (if you have MyChart) OR A paper copy in the mail If you have any lab test that is abnormal or we need to change your treatment, we will call you to review the results.  Testing/Procedures: NONE ordered at this time of appointment   Follow-Up: At [facility], you and your health needs are our priority.  As part of our continuing mission to provide you with exceptional heart care, we have created designated Provider Care Teams.  These Care Teams include your primary Cardiologist (physician) and Advanced Practice Providers (APPs -  Physician Assistants and Nurse Practitioners) who all work together to provide you with the care you need, when you need it.  Your next appointment:   1 year(s)  The format for your next appointment:   In Person  Provider:   Elouise Munroe, MD    Other Instructions

## 2021-07-04 DIAGNOSIS — R112 Nausea with vomiting, unspecified: Secondary | ICD-10-CM | POA: Diagnosis not present

## 2021-07-04 DIAGNOSIS — Z20822 Contact with and (suspected) exposure to covid-19: Secondary | ICD-10-CM | POA: Diagnosis not present

## 2021-07-04 DIAGNOSIS — R197 Diarrhea, unspecified: Secondary | ICD-10-CM | POA: Diagnosis not present

## 2021-07-06 ENCOUNTER — Telehealth: Payer: BC Managed Care – PPO | Admitting: Medical

## 2021-10-31 ENCOUNTER — Telehealth: Payer: Self-pay

## 2021-10-31 NOTE — Telephone Encounter (Signed)
Pt called states migraines worse and can't get sumatriptan filled again until May, wants something else for migraines.  Called left message needs appt to discuss.

## 2021-11-08 ENCOUNTER — Ambulatory Visit (INDEPENDENT_AMBULATORY_CARE_PROVIDER_SITE_OTHER): Payer: BC Managed Care – PPO | Admitting: Medical

## 2021-11-08 ENCOUNTER — Encounter: Payer: Self-pay | Admitting: Medical

## 2021-11-08 VITALS — BP 120/68 | HR 66 | Ht 66.0 in | Wt 174.2 lb

## 2021-11-08 DIAGNOSIS — Z0000 Encounter for general adult medical examination without abnormal findings: Secondary | ICD-10-CM | POA: Diagnosis not present

## 2021-11-08 DIAGNOSIS — G44029 Chronic cluster headache, not intractable: Secondary | ICD-10-CM

## 2021-11-08 DIAGNOSIS — G4700 Insomnia, unspecified: Secondary | ICD-10-CM | POA: Insufficient documentation

## 2021-11-08 DIAGNOSIS — Z902 Acquired absence of lung [part of]: Secondary | ICD-10-CM

## 2021-11-08 DIAGNOSIS — Z87828 Personal history of other (healed) physical injury and trauma: Secondary | ICD-10-CM

## 2021-11-08 DIAGNOSIS — Z1211 Encounter for screening for malignant neoplasm of colon: Secondary | ICD-10-CM | POA: Diagnosis not present

## 2021-11-08 DIAGNOSIS — Z13220 Encounter for screening for lipoid disorders: Secondary | ICD-10-CM

## 2021-11-08 DIAGNOSIS — Z9889 Other specified postprocedural states: Secondary | ICD-10-CM

## 2021-11-08 DIAGNOSIS — Z800 Family history of malignant neoplasm of digestive organs: Secondary | ICD-10-CM

## 2021-11-08 DIAGNOSIS — Z23 Encounter for immunization: Secondary | ICD-10-CM

## 2021-11-08 DIAGNOSIS — Z125 Encounter for screening for malignant neoplasm of prostate: Secondary | ICD-10-CM | POA: Diagnosis not present

## 2021-11-08 MED ORDER — FROVATRIPTAN SUCCINATE 2.5 MG PO TABS: 2.5000 mg | ORAL_TABLET | ORAL | 0 refills | Status: DC | PRN

## 2021-11-08 MED ORDER — ZOLPIDEM TARTRATE 10 MG PO TABS: 10.0000 mg | ORAL_TABLET | Freq: Every evening | ORAL | 0 refills | Status: DC | PRN

## 2021-11-08 NOTE — Assessment & Plan Note (Signed)
Looking back in your prior records you saw neurology in the past and was on verapamil for prevention at 1 point as well as oxygen and triptan medication for flareups.  I recommend we refer you back to neurology.  In the meantime you can use acute medicine to knock out the headache for acute flareup.  Consider going back on the verapamil.  Reduce stress where possible.  Lets try to improve sleep which may help your headaches as well. ?

## 2021-11-08 NOTE — Patient Instructions (Signed)
Cluster Headache Cluster headaches hurt a lot. They normally happen on one side of your head, but they may switch sides. Often, cluster headaches: Cause a lot of pain. Happen for weeks to months. Last from 15 minutes to 3 hours. Happen at the same time each day. Happen at night. Happen many times a day. Happen more often in the fall and springtime. What are the causes? The exact cause is not known. They are not usually caused by foods, changes in body chemicals (hormonal changes), or stress. What increases the risk? Being a male between the ages of 20-50 years old. Smoking or using products that contain nicotine or tobacco. Having elevated levels of body chemical called histamine. This can happen in people who have allergies. Taking certain medicines that cause blood vessels to expand. Having a parent or brother or sister who has cluster headaches. What are the signs or symptoms? Very bad pain on one side of the head that begins behind or around your eye but may spread to your face, head, and neck. Feeling like you may vomit (nauseous). Being sensitive to light. Runny nose and stuffy nose. Swelling of the forehead or face on the affected side. Eye problems. This might include a droopy or swollen eyelid, eye redness, or tearing on the affected side. Feeling restless or upset. Pale skin or a flushed face. How is this treated? Medicines. Oxygen that is breathed in through a mask. Follow these instructions at home: Headache diary Keep a headache diary as told by your doctor. Doing this can help you and your doctor figure out what triggers your headaches. In your headache diary, include information about: The time of day that your headache started and what you were doing when it began. How long your headache lasted. Where your pain started and whether it moved to other areas. The type of pain. Your level of pain. Use a pain scale and rate the pain with a number from 1 (mild) up to 10  (very bad). The treatment that you used, and any change in symptoms after treatment.  Medicines Take over-the-counter and prescription medicines only as told by your health care provider. Ask your doctor if the medicine prescribed to you: Requires you to avoid driving or using machinery. Can cause trouble pooping (constipation). You may need to take these actions to prevent or treat trouble pooping: Drink enough fluid to keep your pee (urine) pale yellow. Take over-the-counter or prescription medicines. Eat foods that are high in fiber. These include beans, whole grains, and fresh fruits and vegetables. Limit foods that are high in fat and processed sugars. These include fried or sweet foods. Lifestyle  Go to bed at the same time each night. Get the same amount of sleep every night. Get 7-9 hours of sleep each night, or the amount recommended by your doctor. Limit or manage stress. Exercise regularly. Exercise for at least 30 minutes, 5 times each week. Eat a healthy diet. Avoid any foods that you know may trigger your headaches. Do not drink alcohol. Do not use any products that contain nicotine or tobacco, such as cigarettes, e-cigarettes, and chewing tobacco. If you need help quitting, ask your doctor. General instructions Use oxygen as told by your doctor. Keep all follow-up visits as told by your health care provider. This is important. Contact a doctor if: Your headaches: Change. Get worse. Happen more often. Your medicines or oxygen are not helping. Get help right away if: You faint. You get weak or lose feeling (have   numbness) on one side of your body or face. You see two of everything (double vision). You feel you may vomit or you vomit and it does stop after many hours. You have trouble with your balance or with walking. You have trouble talking. You have neck pain or stiffness and you have a fever. Summary Cluster headaches hurt a lot. Keep a headache diary. Do not  drink alcohol. Medicines and oxygen may help you feel better. This information is not intended to replace advice given to you by your health care provider. Make sure you discuss any questions you have with your health care provider. Document Revised: 03/04/2020 Document Reviewed: 08/06/2019 Elsevier Patient Education  2023 [employer]  

## 2021-11-08 NOTE — Assessment & Plan Note (Signed)
Practice good sleep hygiene, reduce stress where possible.  Begin trial of Ambien as needed.    ?

## 2021-11-08 NOTE — Progress Notes (Signed)
Subjective:  ? ?HPI ? Richard Hughes is a 51 y.o. male who presents for ?Chief Complaint  ?Patient presents with  ? fasting cpe  ?  Fasting cpe, still having headaches. Due for colonoscopy and tetanus. Not sure his last tetanus  ? ? ?Patient Care Team: ?Derion Kreiter, Leward Quan as PCP - General (Family Medicine) ?Elouise Munroe, MD as PCP - Cardiology (Cardiology) ?Sees dentist ?Sees eye doctor ? ?Concerns: ?Headache  ?This is a recurrent problem. The current episode started 1 to 4 weeks ago. The problem occurs daily. The problem has been gradually worsening. The pain is located in the Right unilateral region. The pain does not radiate. The pain quality is similar to prior headaches. The quality of the pain is described as stabbing and sharp. The pain is moderate. Associated symptoms include insomnia. Pertinent negatives include no ear pain, eye redness, facial sweating, fever, hearing loss, seizures, tinnitus or visual change. Nothing aggravates the symptoms. He has tried triptans for the symptoms. The treatment provided mild relief.  ? ?Thinks stress is triggering headaches.   Mom in hospital with colon cancer, wife sick recently. ? ?Reviewed their medical, surgical, family, social, medication, and allergy history and updated chart as appropriate. ? ?Past Medical History:  ?Diagnosis Date  ? Cluster headache   ? Concussion 01/2018  ? after fainting.  ? H/O skin graft   ? Paralyzed vocal cords 1995  ? resolved  ? Status post pneumonectomy   ? ? ?Past Surgical History:  ?Procedure Laterality Date  ? LARYNX SURGERY    ? with teflon reconstruction s/p prior trauma, work injury  ? total lung removal Left   ? 1995  ? ? ?Family History  ?Problem Relation Age of Onset  ? Cancer Mother   ?     colon  ? Heart attack Mother   ? Heart disease Father   ? Hyperlipidemia Father   ? Hypertension Father   ? Cancer Maternal Grandmother   ? ? ? ?Current Outpatient Medications:  ?  albuterol (VENTOLIN HFA) 108 (90 Base) MCG/ACT  inhaler, Inhale 2 puffs into the lungs every 6 (six) hours as needed for wheezing or shortness of breath., Disp: 8 g, Rfl: 1 ?  frovatriptan (FROVA) 2.5 MG tablet, Take 1 tablet (2.5 mg total) by mouth as needed for migraine. If recurs, may repeat after 2 hours. Max of 3 tabs in 24 hours., Disp: 10 tablet, Rfl: 0 ?  SUMAtriptan (IMITREX) 6 MG/0.5ML SOLN injection, SMARTSIG:0.5 Milliliter(s) SUB-Q Every 2 Hours PRN, Disp: , Rfl:  ?  zolpidem (AMBIEN) 10 MG tablet, Take 1 tablet (10 mg total) by mouth at bedtime as needed for sleep., Disp: 30 tablet, Rfl: 0 ? ?Allergies  ?Allergen Reactions  ? Fentanyl Hives  ? ? ? ?Review of Systems ?Constitutional: -fever, -chills, -sweats, -unexpected weight change, -decreased appetite, -fatigue ?Allergy: -sneezing, -itching, -congestion ?Dermatology: -changing moles, --rash, -lumps ?ENT: -runny nose, -ear pain, -sore throat, -hoarseness, -sinus pain, -teeth pain, - ringing in ears, -hearing loss, -nosebleeds ?Cardiology: -chest pain, -palpitations, -swelling, -difficulty breathing when lying flat, -waking up short of breath ?Respiratory: -cough, -shortness of breath, -difficulty breathing with exercise or exertion, -wheezing, -coughing up blood ?Gastroenterology: -abdominal pain, -nausea, -vomiting, -diarrhea, -constipation, -blood in stool, -changes in bowel movement, -difficulty swallowing or eating ?Hematology: -bleeding, -bruising  ?Musculoskeletal: -joint aches, -muscle aches, -joint swelling, -back pain, -neck pain, -cramping, -changes in gait ?Ophthalmology: denies vision changes, eye redness, itching, discharge ?Urology: -burning with urination, -difficulty urinating, -blood  in urine, -urinary frequency, -urgency, -incontinence ?Neurology: +headache, -weakness, -tingling, -numbness, -memory loss, -falls, -dizziness ?Psychology: -depressed mood, -agitation, +sleep problems ?Male GU: no testicular mass, pain, no lymph nodes swollen, no swelling, no rash. ? ? ?  11/08/2021  ?   1:40 PM 03/13/2021  ?  2:43 PM 01/29/2018  ?  4:00 PM  ?Depression screen PHQ 2/9  ?Decreased Interest 0 0 0  ?Down, Depressed, Hopeless 0 0 0  ?PHQ - 2 Score 0 0 0  ? ? ?   ? ?Objective:  ?BP 120/68   Pulse 66   Ht '5\' 6"'$  (1.676 m)   Wt 174 lb 3.2 oz (79 kg)   BMI 28.12 kg/m?  ? ?General appearance: alert, no distress, WD/WN, African American male ?Skin: tattoo of name right neck, no worrisome lesions ?HEENT: normocephalic, conjunctiva/corneas normal, sclerae anicteric, PERRLA, EOMi, nares patent, no discharge or erythema, pharynx normal ?Oral cavity: MMM, tongue normal, teeth normal ?Neck: supple, no lymphadenopathy, no thyromegaly, no masses, normal ROM, no bruits ?Chest: non tender, normal shape and expansion ?Heart: RRR, normal S1, S2, no murmurs ?Lungs: CTA bilaterally, no wheezes, rhonchi, or rales ?Abdomen: +bs, soft, non tender, non distended, no masses, no hepatomegaly, no splenomegaly, no bruits ?Back: non tender, normal ROM, no scoliosis ?Musculoskeletal: upper extremities non tender, no obvious deformity, normal ROM throughout, lower extremities non tender, no obvious deformity, normal ROM throughout ?Extremities: no edema, no cyanosis, no clubbing ?Pulses: 2+ symmetric, upper and lower extremities, normal cap refill ?Neurological: alert, oriented x 3, CN2-12 intact, strength normal upper extremities and lower extremities, sensation normal throughout, DTRs 2+ throughout, no cerebellar signs, gait normal ?Psychiatric: normal affect, behavior normal, pleasant  ?GU: normal male external genitalia,circumcised, nontender, no masses, no hernia, no lymphadenopathy ?Rectal: deferred/declined ? ? ?Assessment and Plan :  ? ?Encounter Diagnoses  ?Name Primary?  ? Encounter for health maintenance examination in adult Yes  ? Screening for prostate cancer   ? Screen for colon cancer   ? Screening for lipid disorders   ? Status post pneumonectomy   ? History of trauma of chest   ? History of lung surgery   ? Chronic  cluster headache, not intractable   ? Family history of colon cancer   ? Need for Td vaccine   ? Insomnia, unspecified type   ? ? ?This visit was a preventative care visit, also known as wellness visit or routine physical.   Topics typically include healthy lifestyle, diet, exercise, preventative care, vaccinations, sick and well care, proper use of emergency dept and after hours care, as well as other concerns.   ? ? ?Recommendations: ?Continue to return yearly for your annual wellness and preventative care visits.  This gives [place] a chance to discuss healthy lifestyle, exercise, vaccinations, review your chart record, and perform screenings where appropriate. ? ?I recommend you see your eye doctor yearly for routine vision care. ? ?I recommend you see your dentist yearly for routine dental care including hygiene visits twice yearly. ? ? ?Vaccination recommendations were reviewed ?Immunization History  ?Administered Date(s) Administered  ? Td 11/08/2021  ? ? ?Counseled on the Td (tetanus, diptheria) vaccine.  Vaccine information sheet given. Td vaccine given after consent obtained. ? ? ? ?Screening for cancer: ?Colon cancer screening: ?We will refer you for screening colonoscopy ? ?We discussed PSA, prostate exam, and prostate cancer screening risks/benefits.    ? ?Skin cancer screening: ?Check your skin regularly for new changes, growing lesions, or other lesions of concern ?Come  in for evaluation if you have skin lesions of concern. ? ?Lung cancer screening: ?If you have a greater than 20 pack year history of tobacco use, then you may qualify for lung cancer screening with a chest CT scan.   Please call your insurance company to inquire about coverage for this test. ? ?We currently don't have screenings for other cancers besides breast, cervical, colon, and lung cancers.  If you have a strong family history of cancer or have other cancer screening concerns, please let me know.  ? ? ?Bone health: ?Get at least 150  minutes of aerobic exercise weekly ?Get weight bearing exercise at least once weekly ?Bone density test:  ?A bone density test is an imaging test that uses a type of X-ray to measure the amount of calcium a

## 2021-11-09 ENCOUNTER — Other Ambulatory Visit: Payer: Self-pay | Admitting: Medical

## 2021-11-09 DIAGNOSIS — R7989 Other specified abnormal findings of blood chemistry: Secondary | ICD-10-CM

## 2021-11-09 LAB — LIPID PANEL
Chol/HDL Ratio: 1.8 ratio (ref 0.0–5.0)
Cholesterol, Total: 161 mg/dL (ref 100–199)
HDL: 90 mg/dL (ref >39)
LDL Chol Calc (NIH): 63 mg/dL (ref 0–99)
Triglycerides: 35 mg/dL (ref 0–149)
VLDL Cholesterol Cal: 8 mg/dL (ref 5–40)

## 2021-11-09 LAB — COMPREHENSIVE METABOLIC PANEL
ALT: 29 IU/L (ref 0–44)
AST: 26 IU/L (ref 0–40)
Albumin/Globulin Ratio: 1.8 (ref 1.2–2.2)
Albumin: 4.6 g/dL (ref 4.0–5.0)
Alkaline Phosphatase: 46 IU/L (ref 44–121)
BUN/Creatinine Ratio: 12 (ref 9–20)
BUN: 15 mg/dL (ref 6–24)
Bilirubin Total: 0.8 mg/dL (ref 0.0–1.2)
CO2: 24 mmol/L (ref 20–29)
Calcium: 9.7 mg/dL (ref 8.7–10.2)
Chloride: 103 mmol/L (ref 96–106)
Creatinine, Ser: 1.3 mg/dL — ABNORMAL HIGH (ref 0.76–1.27)
Globulin, Total: 2.5 g/dL (ref 1.5–4.5)
Glucose: 81 mg/dL (ref 70–99)
Potassium: 4.6 mmol/L (ref 3.5–5.2)
Sodium: 140 mmol/L (ref 134–144)
Total Protein: 7.1 g/dL (ref 6.0–8.5)
eGFR: 67 mL/min/{1.73_m2} (ref >59)

## 2021-11-09 LAB — CBC
Hematocrit: 45.8 % (ref 37.5–51.0)
Hemoglobin: 16 g/dL (ref 13.0–17.7)
MCH: 31.5 pg (ref 26.6–33.0)
MCHC: 34.9 g/dL (ref 31.5–35.7)
MCV: 90 fL (ref 79–97)
Platelets: 300 10*3/uL (ref 150–450)
RBC: 5.08 x10E6/uL (ref 4.14–5.80)
RDW: 12 % (ref 11.6–15.4)
WBC: 5.1 10*3/uL (ref 3.4–10.8)

## 2021-11-09 LAB — SEDIMENTATION RATE: Sed Rate: 4 mm/hr (ref 0–15)

## 2021-11-09 LAB — PSA: Prostate Specific Ag, Serum: 0.6 ng/mL (ref 0.0–4.0)

## 2021-12-07 ENCOUNTER — Other Ambulatory Visit: Payer: BC Managed Care – PPO

## 2021-12-07 DIAGNOSIS — R7989 Other specified abnormal findings of blood chemistry: Secondary | ICD-10-CM | POA: Diagnosis not present

## 2021-12-07 LAB — BASIC METABOLIC PANEL
BUN/Creatinine Ratio: 8 — ABNORMAL LOW (ref 9–20)
BUN: 11 mg/dL (ref 6–24)
CO2: 23 mmol/L (ref 20–29)
Calcium: 9.8 mg/dL (ref 8.7–10.2)
Chloride: 99 mmol/L (ref 96–106)
Creatinine, Ser: 1.38 mg/dL — ABNORMAL HIGH (ref 0.76–1.27)
Glucose: 87 mg/dL (ref 70–99)
Potassium: 4.3 mmol/L (ref 3.5–5.2)
Sodium: 137 mmol/L (ref 134–144)
eGFR: 63 mL/min/{1.73_m2} (ref >59)

## 2021-12-08 LAB — URINALYSIS
Bilirubin, UA: NEGATIVE
Glucose, UA: NEGATIVE
Ketones, UA: NEGATIVE
Leukocytes,UA: NEGATIVE
Nitrite, UA: NEGATIVE
Protein,UA: NEGATIVE
RBC, UA: NEGATIVE
Specific Gravity, UA: 1.017 (ref 1.005–1.030)
Urobilinogen, Ur: 0.2 mg/dL (ref 0.2–1.0)
pH, UA: 6 (ref 5.0–7.5)

## 2021-12-14 ENCOUNTER — Ambulatory Visit (INDEPENDENT_AMBULATORY_CARE_PROVIDER_SITE_OTHER): Payer: BC Managed Care – PPO | Admitting: Medical

## 2021-12-14 VITALS — BP 110/70 | HR 55 | Temp 97.5°F | Wt 170.8 lb

## 2021-12-14 DIAGNOSIS — F430 Acute stress reaction: Secondary | ICD-10-CM

## 2021-12-14 DIAGNOSIS — H53149 Visual discomfort, unspecified: Secondary | ICD-10-CM | POA: Diagnosis not present

## 2021-12-14 DIAGNOSIS — G44021 Chronic cluster headache, intractable: Secondary | ICD-10-CM | POA: Diagnosis not present

## 2021-12-14 MED ORDER — OXYCODONE-ACETAMINOPHEN 7.5-325 MG PO TABS: 1.0000 | ORAL_TABLET | Freq: Two times a day (BID) | ORAL | 0 refills | Status: DC | PRN

## 2021-12-14 MED ORDER — SUMATRIPTAN SUCCINATE 6 MG/0.5ML ~~LOC~~ SOLN: 6.0000 mg | SUBCUTANEOUS | 2 refills | Status: DC | PRN

## 2021-12-14 MED ORDER — PREDNISONE 10 MG PO TABS: ORAL_TABLET | ORAL | 0 refills | Status: DC

## 2021-12-14 NOTE — Progress Notes (Signed)
Subjective:  Richard Hughes is a 51 y.o. male who presents for Chief Complaint  Patient presents with   headache    Headache x 2018. But since February off and on but last 3 weeks everyday, ran out of headache medication and been taking excedrin migraine medication     Here for headaches.  He notes hx/o headaches since 2018 intermittent.   This year more frequency since February.   He has been dealing with stress.  His mother is fighting cancer, his wife just recently had to have a hysterectomy.  He has seen neurology back in 2019.  He has an upcoming appointment with neurology but is not till July over a month away.  His headaches are daily of late.  He is out of his medication sumatriptan and.  In the past he was on other medicines.  He does not remember the name of them.  Typically recent headaches are every day all day, rotates left  to right or front, stabbing pains.   Using Excedrin, ibuprofen.  No appetite currently which is leading to a bit of nausea.    No other aggravating or relieving factors.    No other c/o.  Past Medical History:  Diagnosis Date   Cluster headache    Concussion 01/2018   after fainting.   H/O skin graft    Paralyzed vocal cords 1995   resolved   Status post pneumonectomy     Current Outpatient Medications on File Prior to Visit  Medication Sig Dispense Refill   albuterol (VENTOLIN HFA) 108 (90 Base) MCG/ACT inhaler Inhale 2 puffs into the lungs every 6 (six) hours as needed for wheezing or shortness of breath. 8 g 1   zolpidem (AMBIEN) 10 MG tablet Take 1 tablet (10 mg total) by mouth at bedtime as needed for sleep. 30 tablet 0   frovatriptan (FROVA) 2.5 MG tablet Take 1 tablet (2.5 mg total) by mouth as needed for migraine. If recurs, may repeat after 2 hours. Max of 3 tabs in 24 hours. (Patient not taking: Reported on 12/14/2021) 10 tablet 0   SUMAtriptan (IMITREX) 6 MG/0.5ML SOLN injection SMARTSIG:0.5 Milliliter(s) SUB-Q Every 2 Hours PRN (Patient not  taking: Reported on 12/14/2021)     No current facility-administered medications on file prior to visit.     The following portions of the patient's history were reviewed and updated as appropriate: allergies, current medications, past family history, past medical history, past social history, past surgical history and problem list.  ROS Otherwise as in subjective above   Objective: BP 110/70   Pulse (!) 55   Temp (!) 97.5 F (36.4 C)   Wt 170 lb 12.8 oz (77.5 kg)   BMI 27.57 kg/m   General appearance: alert, no distress, well developed, well nourished HEENT: normocephalic, sclerae anicteric, conjunctiva pink and moist, nares patent, no discharge or erythema, pharynx normal Oral cavity: MMM, no lesions Neck: supple, no lymphadenopathy, no thyromegaly, no masses, no bruits Heart: RRR, normal S1, S2, no murmurs Lungs: CTA bilaterally, no wheezes, rhonchi, or rales  neuro: Positive photophobia, otherwise CN II through XII intact, nonfocal exam Pulses: 2+ radial pulses, 2+ pedal pulses, normal cap refill Ext: no edema   01/21/2018  Head CTA IMPRESSION: Normal CTA HEAD.   01/21/18 CT head IMPRESSION: 1. No acute intracranial abnormalities. 2. No fracture or traumatic malalignment in the cervical spine. Mild multilevel degenerative disc disease.    Echocardiogram 05/2021 IMPRESSIONS     1. Left ventricular ejection  fraction, by estimation, is 60 to 65%. The  left ventricle has normal function. Left ventricular endocardial border  not optimally defined to evaluate regional wall motion, however wall  motion appears grossly normal. Left  ventricular diastolic parameters were normal.   2. Right ventricular systolic function is normal. The right ventricular  size is normal. There is normal pulmonary artery systolic pressure. The  estimated right ventricular systolic pressure is 60.6 mmHg.   3. A small pericardial effusion is present. The pericardial effusion is  posterior to  the left ventricle.   4. The mitral valve is normal in structure. Trivial mitral valve  regurgitation. No evidence of mitral stenosis.   5. The aortic valve is tricuspid. Aortic valve regurgitation is trivial.  No aortic stenosis is present.   6. The inferior vena cava is normal in size with greater than 50%  respiratory variability, suggesting right atrial pressure of 3 mmHg.       Assessment: Encounter Diagnoses  Name Primary?   Intractable chronic cluster headache Yes   Photophobia    Acute stress reaction      Plan: Reviewing back in the chart record he has had cluster headaches for several years.  I reviewed back over his neurology notes from 2019.  At that time he was on verapamil for prevention, sumatriptan and oxygen for acute therapy.  He has had some stressful life events recently which is probably leading to some of his headaches.  I reviewed prior head and heart imaging as noted above in the chart record.  I will reach out to cardiology and neurology to inquire about their input on preventative medications.  Given his bradycardia, this may limit some of our options.  We discussed the following recommendations which were also printed for him  Patient Instructions  I prescribed 3 medications today  You can use the prednisone starting today.  Prednisone taper is to help knock out the current headache.  Take this as directed on the labeled including 6 tablets today, 5 tablets tomorrow, 4 tablets on day 3, 3 tablets on day 4, 2 tablets on day 5, 1 tablet on day 6  You can also today use either sumatriptan injection or oxycodone/Percocet pain medication to help with the headache currently.  Do not use these at the same time.  For example if you use sumatriptan hand today after 2 or 3 hours you can either use sumatriptan again or you could try the Percocet at that point  Follow-up with neurology as planned  I am going to send a message to cardiology and neurology to get  some input on options for preventative therapy.  Given that your heart rate is slower than normal or bradycardia, you may not be able to use some of the typical medicines in treating cluster headaches.   Richard Hughes was seen today for headache.  Diagnoses and all orders for this visit:  Intractable chronic cluster headache  Photophobia  Acute stress reaction  Other orders -     predniSONE (DELTASONE) 10 MG tablet; 6 tablets all together day 1, 5 tablets day 2, 4 tablets day 3, 3 tablets day 4, 2 tablets day 5, 1 tablet day 6. -     oxyCODONE-acetaminophen (PERCOCET) 7.5-325 MG tablet; Take 1 tablet by mouth 2 (two) times daily as needed for severe pain. -     SUMAtriptan (IMITREX) 6 MG/0.5ML SOLN injection; Inject 0.5 mLs (6 mg total) into the skin every 2 (two) hours as needed for migraine or  headache. May repeat in 2 hours if headache persists or recurs.    Follow up: with neurology, pending call back

## 2021-12-14 NOTE — Patient Instructions (Signed)
I prescribed 3 medications today  You can use the prednisone starting today.  Prednisone taper is to help knock out the current headache.  Take this as directed on the labeled including 6 tablets today, 5 tablets tomorrow, 4 tablets on day 3, 3 tablets on day 4, 2 tablets on day 5, 1 tablet on day 6  You can also today use either sumatriptan injection or oxycodone/Percocet pain medication to help with the headache currently.  Do not use these at the same time.  For example if you use sumatriptan hand today after 2 or 3 hours you can either use sumatriptan again or you could try the Percocet at that point  Follow-up with neurology as planned  I am going to send a message to cardiology and neurology to get some input on options for preventative therapy.  Given that your heart rate is slower than normal or bradycardia, you may not be able to use some of the typical medicines in treating cluster headaches.

## 2021-12-19 ENCOUNTER — Telehealth: Payer: Self-pay | Admitting: Medical

## 2021-12-19 NOTE — Telephone Encounter (Signed)
Pt came in and dropped of fmla forms. Given to [place] per [place] protocol.

## 2021-12-21 ENCOUNTER — Other Ambulatory Visit: Payer: Self-pay | Admitting: Medical

## 2021-12-21 ENCOUNTER — Telehealth: Payer: Self-pay | Admitting: Medical

## 2021-12-21 MED ORDER — EMGALITY (300 MG DOSE) 100 MG/ML ~~LOC~~ SOSY: 300.0000 mg | PREFILLED_SYRINGE | SUBCUTANEOUS | 2 refills | Status: DC

## 2021-12-21 NOTE — Telephone Encounter (Signed)
I did communicate with neurology.  I recommended for prevention considering either a medicine called Emgality 300 mg/month or Topamax 50 mg twice daily.  Let me know if you would be willing to try 1 of these and I will send it to the pharmacy.  They are trying to look at the schedule to get you in sooner.  See FMLA paperwork

## 2021-12-21 NOTE — Telephone Encounter (Signed)
Pt was notified that FMLA paperwork is ready for pick up no charge and pt would like to try injection first. Can you send in medication

## 2021-12-26 ENCOUNTER — Ambulatory Visit (INDEPENDENT_AMBULATORY_CARE_PROVIDER_SITE_OTHER): Payer: BC Managed Care – PPO | Admitting: Diagnostic Neuroimaging

## 2021-12-26 ENCOUNTER — Encounter: Payer: Self-pay | Admitting: Diagnostic Neuroimaging

## 2021-12-26 VITALS — BP 126/74 | HR 69 | Ht 65.0 in | Wt 186.4 lb

## 2021-12-26 DIAGNOSIS — G44029 Chronic cluster headache, not intractable: Secondary | ICD-10-CM | POA: Diagnosis not present

## 2021-12-26 MED ORDER — SUMATRIPTAN SUCCINATE 6 MG/0.5ML ~~LOC~~ SOAJ: 6.0000 mg | Freq: Every day | SUBCUTANEOUS | 6 refills | Status: DC | PRN

## 2021-12-26 NOTE — Patient Instructions (Signed)
CLUSTER HEADACHES - start emgality '300mg'$  injections monthly (rx'd by PCP; pending approval) - sumatriptan injection as needed (changed to autoinjector) - home oxygen as needed (15 minutes of 12L/min via [facility])

## 2021-12-26 NOTE — Progress Notes (Signed)
[facility]  PATIENT: MEREDITH MELLS DOB: 07-Jul-1971  REFERRING CLINICIAN: Carlena Hurl, PA-C  HISTORY FROM: patient  REASON FOR VISIT: new consult    HISTORICAL  CHIEF COMPLAINT:  Chief Complaint  Patient presents with   Follow-up    Rm 7 alone here for f/u on cluster h/a. Last visit was in 2019. Pt reports he was doing well prior to Feb of this year. In Feb frequency and severity has progressively worsened.  Reports he is taking imitrex and is helpful PCP rx'd emgality but has not picked up yet. PCP has completed FMLA for him approved 3 days a month has exceeded this amount for June already. Confirms he still has the oxygen tank as prescribed in 2019, but has not used since.     HISTORY OF PRESENT ILLNESS:   UPDATE (12/26/21, VRP): Since last visit, doing well until Feb 2023. Now rx'd emgality (pending pickup) and sumatriptan injections (helping). Daily dull HA + 2 severe attacks per day.   PRIOR HPI (572): 51 year old male here for evaluation of cluster headaches.  Patient has had these headaches since high school with left-sided, periorbital, throbbing severe pain associated with soreness over the scalp, lacrimation, scleral injection, photophobia, nausea, blurred vision and seeing sparkles.  Patient was patient diagnosed with cluster headaches and tried on verapamil in the past around 2015.  Also was tried on home oxygen which seemed to help.  Headaches resolved spontaneously and patient had no further follow-up in neurology.  Since that time headaches have returned in August 2019.  Now having headaches lasting 1 to 3 hours at a time.  Headaches can occur several times per day and in clusters lasting 3 to 6 weeks at a time.  In the past he has had periods of remission lasting up to 2 to 3 years at a time.  In July 2019 patient was laughing, all of a sudden got lightheaded and passed out.  He has had had a concussion.  Those symptoms have resolved.  Patient  was involved in the emergency room.   REVIEW OF SYSTEMS: Full 14 system review of systems performed and negative with exception of: as per HPI.   ALLERGIES: Allergies  Allergen Reactions   Fentanyl Hives    HOME MEDICATIONS: Outpatient Medications Prior to Visit  Medication Sig Dispense Refill   albuterol (VENTOLIN HFA) 108 (90 Base) MCG/ACT inhaler Inhale 2 puffs into the lungs every 6 (six) hours as needed for wheezing or shortness of breath. 8 g 1   Galcanezumab-gnlm (EMGALITY, 300 MG DOSE,) 100 MG/ML SOSY Inject 300 mg into the skin every 30 (thirty) days. 3.08 mL 2   oxyCODONE-acetaminophen (PERCOCET) 7.5-325 MG tablet Take 1 tablet by mouth 2 (two) times daily as needed for severe pain. (Patient not taking: Reported on 12/26/2021) 12 tablet 0   zolpidem (AMBIEN) 10 MG tablet Take 1 tablet (10 mg total) by mouth at bedtime as needed for sleep. 30 tablet 0   frovatriptan (FROVA) 2.5 MG tablet Take 1 tablet (2.5 mg total) by mouth as needed for migraine. If recurs, may repeat after 2 hours. Max of 3 tabs in 24 hours. (Patient not taking: Reported on 12/26/2021) 10 tablet 0   predniSONE (DELTASONE) 10 MG tablet 6 tablets all together day 1, 5 tablets day 2, 4 tablets day 3, 3 tablets day 4, 2 tablets day 5, 1 tablet day 6. 21 tablet 0   SUMAtriptan (IMITREX) 6 MG/0.5ML SOLN injection SMARTSIG:0.5 Milliliter(s) SUB-Q Every 2  Hours PRN (Patient not taking: Reported on 12/26/2021)     SUMAtriptan (IMITREX) 6 MG/0.5ML SOLN injection Inject 0.5 mLs (6 mg total) into the skin every 2 (two) hours as needed for migraine or headache. May repeat in 2 hours if headache persists or recurs. 0.5 mL 2   No facility-administered medications prior to visit.    PAST MEDICAL HISTORY: Past Medical History:  Diagnosis Date   Cluster headache    Concussion 01/2018   after fainting.   H/O skin graft    Paralyzed vocal cords 1995   resolved   Status post pneumonectomy     PAST SURGICAL HISTORY: Past  Surgical History:  Procedure Laterality Date   LARYNX SURGERY     with teflon reconstruction s/p prior trauma, work injury   total lung removal Left    1995    FAMILY HISTORY: Family History  Problem Relation Age of Onset   Cancer Mother        colon   Heart attack Mother    Heart disease Father    Hyperlipidemia Father    Hypertension Father    Cancer Maternal Grandmother     SOCIAL HISTORY: Social History   Socioeconomic History   Marital status: Married    Spouse name: Audiological scientist   Number of children: 3   Years of education: college   Highest education level: Not on file  Occupational History    Employer: [employer]  Tobacco Use   Smoking status: Never   Smokeless tobacco: Never  Substance and Sexual Activity   Alcohol use: Yes    Comment: Socailly    Drug use: No   Sexual activity: Not on file  Other Topics Concern   Not on file  Social History Narrative   ** Merged History Encounter **       Patient is married Tax adviser ), 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 Wt Readings from Last 3 Encounters:  12/26/21 186 lb 6 oz (84.5 kg)  12/14/21 170 lb 12.8 oz (77.5 kg)  11/08/21 174 lb 3.2 oz (79 kg)   Patient is in no distress; well developed, nourished and groomed; neck is supple  CARDIOVASCULAR: Examination of carotid arteries is normal; no carotid bruits Regular rate and rhythm, no murmurs Examination of peripheral vascular  system by observation and palpation is normal  EYES: Ophthalmoscopic exam of optic discs and posterior segments is normal; no papilledema or hemorrhages No results found.   MUSCULOSKELETAL: Gait, strength, tone, movements noted in Neurologic exam below  NEUROLOGIC: MENTAL STATUS:      No data to display         awake, alert, oriented to person, place and time recent and remote memory intact normal attention and concentration language fluent, comprehension intact, naming intact fund of knowledge appropriate  CRANIAL NERVE:  2nd - no papilledema on fundoscopic exam 2nd,  3rd, 4th, 6th - pupils equal and reactive to light, visual fields full to confrontation, extraocular muscles intact, no nystagmus 5th - facial sensation symmetric 7th - facial strength symmetric 8th - hearing intact 9th - palate elevates symmetrically, uvula midline 11th - shoulder shrug symmetric 12th - tongue protrusion midline  MOTOR:  normal bulk and tone, full strength in the BUE, BLE  SENSORY:  normal and symmetric to light touch, temperature, vibration  COORDINATION:  finger-nose-finger, fine finger movements normal  REFLEXES:  deep tendon reflexes present and symmetric  GAIT/STATION:  narrow based gait; able to walk tandem     DIAGNOSTIC DATA (LABS, IMAGING, TESTING) - I reviewed patient records, labs, notes, testing and imaging myself where available.  Lab Results  Component Value Date   WBC 5.1 11/08/2021   HGB 16.0 11/08/2021   HCT 45.8 11/08/2021   MCV 90 11/08/2021   PLT 300 11/08/2021      Component Value Date/Time   NA 137 12/07/2021 0858   K 4.3 12/07/2021 0858   CL 99 12/07/2021 0858   CO2 23 12/07/2021 0858   GLUCOSE 87 12/07/2021 0858   GLUCOSE 99 01/21/2018 1704   BUN 11 12/07/2021 0858   CREATININE 1.38 (H) 12/07/2021 0858   CALCIUM 9.8 12/07/2021 0858   PROT 7.1 11/08/2021 1418   ALBUMIN 4.6 11/08/2021 1418   AST 26 11/08/2021 1418   ALT 29 11/08/2021  1418   ALKPHOS 46 11/08/2021 1418   BILITOT 0.8 11/08/2021 1418   GFRNONAA 68 01/01/2020 1411   GFRAA 79 01/01/2020 1411   Lab Results  Component Value Date   CHOL 161 11/08/2021   HDL 90 11/08/2021   LDLCALC 63 11/08/2021   TRIG 35 11/08/2021   CHOLHDL 1.8 11/08/2021   No results found for: "HGBA1C" No results found for: "VITAMINB12" Lab Results  Component Value Date   TSH 1.850 03/21/2021     09/24/13 MRI brain  - normal  09/24/13 MRA head  - normal  01/21/18 CTA head  - Normal CTA HEAD.    ASSESSMENT AND PLAN  51 y.o. year old male here with episodic cluster headaches.  We will proceed with treatment options.   Dx:  1. Chronic cluster headache, not intractable      PLAN:  CLUSTER HEADACHES - start emgality '300mg'$  injections monthly (rx'd by PCP; pending approval) - sumatriptan injection as needed (changed to autoinjector) - home oxygen as needed (15 minutes of 12L/min via NR[facility])  Meds ordered this encounter  Medications   SUMAtriptan 6 MG/0.5ML SOAJ    Sig: Inject 6 mg into the skin daily as needed for up to 2 doses (cluster headache).    Dispense:  3 mL    Refill:  6   Return in about 3 months (around 03/28/2022) for with NP Butler Denmark).    Penni Bombard, MD 2/77/8242, 3:53 PM Certified in Neurology, Neurophysiology and Neuroimaging  [facility] [address] [place], [place] [address] [phone]

## 2022-01-22 ENCOUNTER — Ambulatory Visit: Payer: BC Managed Care – PPO | Admitting: Diagnostic Neuroimaging

## 2022-02-06 ENCOUNTER — Encounter: Payer: Self-pay | Admitting: Gastroenterology

## 2022-02-20 ENCOUNTER — Ambulatory Visit (AMBULATORY_SURGERY_CENTER): Payer: Self-pay | Admitting: *Deleted

## 2022-02-20 ENCOUNTER — Telehealth: Payer: Self-pay | Admitting: *Deleted

## 2022-02-20 VITALS — Ht 65.0 in | Wt 166.4 lb

## 2022-02-20 DIAGNOSIS — Z800 Family history of malignant neoplasm of digestive organs: Secondary | ICD-10-CM

## 2022-02-20 DIAGNOSIS — Z1211 Encounter for screening for malignant neoplasm of colon: Secondary | ICD-10-CM

## 2022-02-20 MED ORDER — NA SULFATE-K SULFATE-MG SULF 17.5-3.13-1.6 GM/177ML PO SOLN: 1.0000 | Freq: Once | ORAL | 0 refills | Status: AC

## 2022-02-20 NOTE — Telephone Encounter (Signed)
Richard Hughes,  This pt is cleared for anesthetic care at [facility].  Thanks,  Willma Obando 

## 2022-02-20 NOTE — Progress Notes (Signed)
No egg or soy allergy known to patient  No issues known to pt with past sedation with any surgeries or procedures Patient denies ever being told they had issues or difficulty with intubation  No FH of Malignant Hyperthermia Pt is not on diet pills Pt is not on  home 02  Pt is not on blood thinners  Pt denies issues with constipation  No A fib or A flutter Have any cardiac testing pending--no Pt instructed to use Singlecare.com or GoodRx for a price reduction on prep    During pre-visit pt. Informed that he is prescribed oxygen for cluster headaches but has not used it in 3 years.

## 2022-02-20 NOTE — Telephone Encounter (Signed)
Noted  

## 2022-02-20 NOTE — Telephone Encounter (Signed)
During pre-visit today pt. Stated that "I am prescribed oxygen for cluster headaches but has not used in over 3 years,advise if pt.cleared for [facility] ?

## 2022-02-27 ENCOUNTER — Encounter: Payer: Self-pay | Admitting: Gastroenterology

## 2022-02-27 ENCOUNTER — Encounter: Payer: Self-pay | Admitting: Certified Registered Nurse Anesthetist

## 2022-03-06 ENCOUNTER — Ambulatory Visit (AMBULATORY_SURGERY_CENTER): Payer: BC Managed Care – PPO | Admitting: Gastroenterology

## 2022-03-06 ENCOUNTER — Encounter: Payer: Self-pay | Admitting: Gastroenterology

## 2022-03-06 VITALS — BP 109/53 | HR 64 | Temp 97.3°F | Resp 23 | Ht 65.0 in | Wt 166.4 lb

## 2022-03-06 DIAGNOSIS — D124 Benign neoplasm of descending colon: Secondary | ICD-10-CM

## 2022-03-06 DIAGNOSIS — D123 Benign neoplasm of transverse colon: Secondary | ICD-10-CM

## 2022-03-06 DIAGNOSIS — Z1211 Encounter for screening for malignant neoplasm of colon: Secondary | ICD-10-CM | POA: Diagnosis not present

## 2022-03-06 DIAGNOSIS — D125 Benign neoplasm of sigmoid colon: Secondary | ICD-10-CM

## 2022-03-06 DIAGNOSIS — D127 Benign neoplasm of rectosigmoid junction: Secondary | ICD-10-CM | POA: Diagnosis not present

## 2022-03-06 DIAGNOSIS — Z800 Family history of malignant neoplasm of digestive organs: Secondary | ICD-10-CM | POA: Diagnosis not present

## 2022-03-06 MED ORDER — SODIUM CHLORIDE 0.9 % IV SOLN: 500.0000 mL | Freq: Once | INTRAVENOUS | Status: DC

## 2022-03-06 NOTE — Progress Notes (Signed)
[facility] History and Physical   Primary Care Physician:  Carlena Hurl, PA-C   Reason for Procedure:  family history of colon cancer  Plan:    colonoscopy     HPI: Richard Hughes is a 51 y.o. male  here for colonoscopy screening - first time exam, mother had colon cancer dx age 10. Patient denies any bowel symptoms at this time. Otherwise feels well without any cardiopulmonary symptoms.   I have discussed risks / benefits of anesthesia and endoscopic procedure with [name] and they wish to proceed with the exams as outlined today.    Past Medical History:  Diagnosis Date   Cluster headache    Concussion 01/2018   after fainting.   H/O skin graft    Paralyzed vocal cords 1995   resolved   Status post pneumonectomy     Past Surgical History:  Procedure Laterality Date   LARYNX SURGERY     with teflon reconstruction s/p prior trauma, work injury   total lung removal Left    1995    Prior to Admission medications   Medication Sig Start Date End Date Taking? Authorizing Provider  albuterol (VENTOLIN HFA) 108 (90 Base) MCG/ACT inhaler Inhale 2 puffs into the lungs every 6 (six) hours as needed for wheezing or shortness of breath. 03/13/21  Yes Tysinger, Camelia Eng, PA-C  OVER THE COUNTER MEDICATION Ghost energy drink 6 oz 4 x a week   Yes [provider]  OVER THE COUNTER MEDICATION Soursop bitters 2 tablets daily   Yes [provider]  zolpidem (AMBIEN) 10 MG tablet Take 1 tablet (10 mg total) by mouth at bedtime as needed for sleep. 11/08/21 03/06/22 Yes Tysinger, Camelia Eng, PA-C  diphenhydrAMINE HCl (BENADRYL ALLERGY PO) Take by mouth daily. Take one tablet daily    [provider]  Galcanezumab-gnlm (EMGALITY, 300 MG DOSE,) 100 MG/ML SOSY Inject 300 mg into the skin every 30 (thirty) days. Patient not taking: Reported on 02/20/2022 12/21/21   Tysinger, Camelia Eng, PA-C  oxyCODONE-acetaminophen (PERCOCET) 7.5-325 MG tablet Take 1  tablet by mouth 2 (two) times daily as needed for severe pain. Patient not taking: Reported on 12/26/2021 12/14/21   Tysinger, Camelia Eng, PA-C  SUMAtriptan 6 MG/0.5ML SOAJ Inject 6 mg into the skin daily as needed for up to 2 doses (cluster headache). 12/26/21   Penumalli, Earlean Polka, MD    Current Outpatient Medications  Medication Sig Dispense Refill   albuterol (VENTOLIN HFA) 108 (90 Base) MCG/ACT inhaler Inhale 2 puffs into the lungs every 6 (six) hours as needed for wheezing or shortness of breath. 8 g 1   OVER THE COUNTER MEDICATION Ghost energy drink 6 oz 4 x a week     OVER THE COUNTER MEDICATION Soursop bitters 2 tablets daily     zolpidem (AMBIEN) 10 MG tablet Take 1 tablet (10 mg total) by mouth at bedtime as needed for sleep. 30 tablet 0   diphenhydrAMINE HCl (BENADRYL ALLERGY PO) Take by mouth daily. Take one tablet daily     Galcanezumab-gnlm (EMGALITY, 300 MG DOSE,) 100 MG/ML SOSY Inject 300 mg into the skin every 30 (thirty) days. (Patient not taking: Reported on 02/20/2022) 3.08 mL 2   oxyCODONE-acetaminophen (PERCOCET) 7.5-325 MG tablet Take 1 tablet by mouth 2 (two) times daily as needed for severe pain. (Patient not taking: Reported on 12/26/2021) 12 tablet 0   SUMAtriptan 6 MG/0.5ML SOAJ Inject 6 mg into the skin daily as needed for  up to 2 doses (cluster headache). 3 mL 6   Current Facility-Administered Medications  Medication Dose Route Frequency Provider Last Rate Last Admin   0.9 %  sodium chloride infusion  500 mL Intravenous Once Shalinda Burkholder, Carlota Raspberry, MD        Allergies as of 03/06/2022 - Review Complete 03/06/2022  Allergen Reaction Noted   Fentanyl Hives 01/21/2018    Family History  Problem Relation Age of Onset   Colon polyps Mother    Colon cancer Mother    Cancer Mother        colon   Heart attack Mother    Heart disease Father    Hyperlipidemia Father    Hypertension Father    Prostate cancer Maternal Uncle    Cancer Maternal Grandmother    Crohn's  disease Neg Hx    Esophageal cancer Neg Hx    Rectal cancer Neg Hx    Stomach cancer Neg Hx     Social History   Socioeconomic History   Marital status: Married    Spouse name: Audiological scientist   Number of children: 3   Years of education: college   Highest education level: Not on file  Occupational History    Employer: [employer]  Tobacco Use   Smoking status: Never    Passive exposure: Never   Smokeless tobacco: Never  Vaping Use   Vaping Use: Never used  Substance and Sexual Activity   Alcohol use: Yes    Comment: Socailly    Drug use: No   Sexual activity: Not on file  Other Topics Concern   Not on file  Social History Narrative   ** Merged History Encounter **       Patient is married [name] ), has 3 children   Patient is right handed   Education level is college   Caffeine consumption is 0   Social Determinants of Radio broadcast assistant Strain: Not on file  Food Insecurity: Not on file  Transportation Needs: Not on file  Physical Activity: Not on file  Stress: Not on file  Social Connections: Not on file  Intimate Partner Violence: Not on file    Review of Systems: All other review of systems negative except as mentioned in the HPI.  Physical Exam: Vital signs BP 110/66   Pulse 61   Temp (!) 97.3 F (36.3 C)   Resp (!) 21   Ht '5\' 5"'$  (1.651 m)   Wt 166 lb 6.4 oz (75.5 kg)   SpO2 100%   BMI 27.69 kg/m   General:   Alert,  Well-developed, pleasant and cooperative in NAD Lungs:  Clear throughout to auscultation.   Heart:  Regular rate and rhythm Abdomen:  Soft, nontender and nondistended.   Neuro/Psych:  Alert and cooperative. Normal mood and affect. A and O x 3  Jolly Mango, MD [facility]

## 2022-03-06 NOTE — Progress Notes (Signed)
Called to room to assist during endoscopic procedure.  Patient ID and intended procedure confirmed with present staff. Received instructions for my participation in the procedure from the performing physician.  

## 2022-03-06 NOTE — Progress Notes (Signed)
Pt's states no medical or surgical changes since previsit or office visit. 

## 2022-03-06 NOTE — Op Note (Signed)
[facility] Patient Name: Richard Hughes Procedure Date: 03/06/2022 10:05 AM MRN: [id number] Endoscopist: Remo Lipps P. Havery Moros , MD Age: 51 Referring MD:  Date of Birth: 10-07-1970 Gender: Male Account #: [id number] Procedure:                Colonoscopy Indications:              Screening patient at increased risk: Family history                            of 1st-degree relative with colorectal cancer                            (mother dx age 12), This is the patient's first                            colonoscopy Medicines:                Monitored Anesthesia Care Procedure:                Pre-Anesthesia Assessment:                           - Prior to the procedure, a History and Physical                            was performed, and patient medications and                            allergies were reviewed. The patient's tolerance of                            previous anesthesia was also reviewed. The risks                            and benefits of the procedure and the sedation                            options and risks were discussed with the patient.                            All questions were answered, and informed consent                            was obtained. Prior Anticoagulants: The patient has                            taken no previous anticoagulant or antiplatelet                            agents. ASA Grade Assessment: II - A patient with                            mild systemic disease. After reviewing the risks  and benefits, the patient was deemed in                            satisfactory condition to undergo the procedure.                           After obtaining informed consent, the colonoscope                            was passed under direct vision. Throughout the                            procedure, the patient's blood pressure, pulse, and                            oxygen saturations were monitored continuously.  The                            Olympus CF-HQ190L [id number]) Colonoscope was                            introduced through the anus and advanced to the the                            terminal ileum, with identification of the                            appendiceal orifice and IC valve. The colonoscopy                            was performed without difficulty. The patient                            tolerated the procedure well. The quality of the                            bowel preparation was good. The terminal ileum,                            ileocecal valve, appendiceal orifice, and rectum                            were photographed. Scope In: 10:24:24 AM Scope Out: 10:47:09 AM Scope Withdrawal Time: 0 hours 20 minutes 46 seconds  Total Procedure Duration: 0 hours 22 minutes 45 seconds  Findings:                 The perianal and digital rectal examinations were                            normal.                           The terminal ileum appeared normal.  Two sessile polyps were found in the transverse                            colon. The polyps were 4 to 5 mm in size. These                            polyps were removed with a cold snare. Resection                            and retrieval were complete.                           A diminutive polyp was found in the descending                            colon. The polyp was sessile. The polyp was removed                            with a cold snare. Resection and retrieval were                            complete.                           A 3 to 4 mm polyp was found in the sigmoid colon.                            The polyp was sessile. The polyp was removed with a                            cold snare. Resection and retrieval were complete.                           A 3 mm polyp was found in the recto-sigmoid colon.                            The polyp was sessile. The polyp was removed with a                             cold snare. Resection and retrieval were complete.                           Anal papilla(e) were hypertrophied.                           The right colon was tortous. The exam was otherwise                            without abnormality. Complications:            No immediate complications. Estimated blood loss:  Minimal. Estimated Blood Loss:     Estimated blood loss was minimal. Impression:               - The examined portion of the ileum was normal.                           - Two 4 to 5 mm polyps in the transverse colon,                            removed with a cold snare. Resected and retrieved.                           - One diminutive polyp in the descending colon,                            removed with a cold snare. Resected and retrieved.                           - One 3 to 4 mm polyp in the sigmoid colon, removed                            with a cold snare. Resected and retrieved.                           - One 3 mm polyp at the recto-sigmoid colon,                            removed with a cold snare. Resected and retrieved.                           - Anal papilla(e) were hypertrophied.                           - The examination was otherwise normal. Recommendation:           - Patient has a contact number available for                            emergencies. The signs and symptoms of potential                            delayed complications were discussed with the                            patient. Return to normal activities tomorrow.                            Written discharge instructions were provided to the                            patient.                           - Resume previous diet.                           -  Continue present medications.                           - Await pathology results. Remo Lipps P. Havery Moros, MD 03/06/2022 10:52:30 AM This report has been signed electronically.

## 2022-03-06 NOTE — Progress Notes (Signed)
Report given to PACU, vss 

## 2022-03-06 NOTE — Patient Instructions (Signed)
Thank you for coming in to see [place] today!  Resume previous diet and medications today. Return to normal daily activities tomorrow. Biopsies will be available in approximately 1-2 weeks.  Recommendation will be made at that time for your next colonoscopy.  YOU HAD AN ENDOSCOPIC PROCEDURE TODAY AT [facility]:   Refer to the procedure report that was given to you for any specific questions about what was found during the examination.  If the procedure report does not answer your questions, please call your gastroenterologist to clarify.  If you requested that your care partner not be given the details of your procedure findings, then the procedure report has been included in a sealed envelope for you to review at your convenience later.  YOU SHOULD EXPECT: Some feelings of bloating in the abdomen. Passage of more gas than usual.  Walking can help get rid of the air that was put into your GI tract during the procedure and reduce the bloating. If you had a lower endoscopy (such as a colonoscopy or flexible sigmoidoscopy) you may notice spotting of blood in your stool or on the toilet paper. If you underwent a bowel prep for your procedure, you may not have a normal bowel movement for a few days.  Please Note:  You might notice some irritation and congestion in your nose or some drainage.  This is from the oxygen used during your procedure.  There is no need for concern and it should clear up in a day or so.  SYMPTOMS TO REPORT IMMEDIATELY:  Following lower endoscopy (colonoscopy or flexible sigmoidoscopy):  Excessive amounts of blood in the stool  Significant tenderness or worsening of abdominal pains  Swelling of the abdomen that is new, acute  Fever of 100F or higher   For urgent or emergent issues, a gastroenterologist can be reached at any hour by calling [phone]. Do not use MyChart messaging for urgent concerns.    DIET:  We do recommend a small meal at first, but  then you may proceed to your regular diet.  Drink plenty of fluids but you should avoid alcoholic beverages for 24 hours.  ACTIVITY:  You should plan to take it easy for the rest of today and you should NOT DRIVE or use heavy machinery until tomorrow (because of the sedation medicines used during the test).    FOLLOW UP: Our staff will call the number listed on your records the next business day following your procedure.  We will call around 7:15- 8:00 am to check on you and address any questions or concerns that you may have regarding the information given to you following your procedure. If we do not reach you, we will leave a message.  If you develop any symptoms (ie: fever, flu-like symptoms, shortness of breath, cough etc.) before then, please call [phone].  If you test positive for Covid 19 in the 2 weeks post procedure, please call and report this information to [place].    If any biopsies were taken you will be contacted by phone or by letter within the next 1-3 weeks.  Please call us at [phone] if you have not heard about the biopsies in 3 weeks.    SIGNATURES/CONFIDENTIALITY: You and/or your care partner have signed paperwork which will be entered into your electronic medical record.  These signatures attest to the fact that that the information above on your After Visit Summary has been reviewed and is understood.  Full responsibility of the confidentiality  of this discharge information lies with you and/or your care-partner.  

## 2022-03-07 ENCOUNTER — Telehealth: Payer: Self-pay | Admitting: *Deleted

## 2022-03-07 NOTE — Telephone Encounter (Signed)
No answer for post procedure followup call. Left VM. ?

## 2022-03-21 ENCOUNTER — Encounter: Payer: Self-pay | Admitting: Internal Medicine

## 2022-03-29 NOTE — Progress Notes (Unsigned)
Patient: Richard Hughes Date of Birth: [birth year]  Reason for Visit: Follow up History from: Patient Primary Neurologist: Dr. Leta Baptist   ASSESSMENT AND PLAN 51 y.o. year old male   17.  Chronic cluster headache -Ordered Emgality 120 mg injection for headache prevention, given loading dose 240 mg sample in the office to inject at home [phone] K Exp 08/13/23) -Continue Imitrex injection as needed -Has oxygen at home as needed (15 minutes of 12 L NRB) -Follow-up with me in 4 to 6 months virtually or sooner if needed  HISTORY  Today, didn't start Emgality since last seen in June. Thinks was insurance PA issue, rx came from PCP. Headaches twice a month, can last 5 days. Mostly occipital area, lingers even when bad pain has resolved. With migraine features. He has old oxygen tank, he hasn't used. Migraines are intermittent. Takes Imitrex injection with good benefit within 5 minutes, but often returns. 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    UPDATE (12/26/21, VRP): Since last visit, doing well until Feb 2023. Now rx'd emgality (pending pickup) and sumatriptan injections (helping). Daily dull HA + 2 severe attacks per day.    PRIOR HPI (7965): 51 year old male here for evaluation of cluster headaches.  Patient has had these headaches since high school with left-sided, periorbital, throbbing severe pain associated with soreness over the scalp, lacrimation, scleral injection, photophobia, nausea, blurred vision and seeing sparkles.  Patient was patient diagnosed with cluster headaches and tried on verapamil in the past around 2015.  Also was tried on home oxygen which seemed to help.  Headaches resolved spontaneously and patient had no further follow-up in neurology.   Since that time headaches have returned in August 2019.  Now having headaches lasting 1 to 3 hours at a time.  Headaches can occur several times per day and in clusters lasting 3 to 6 weeks at a time.  In  the past he has had periods of remission lasting up to 2 to 3 years at a time.   In July 2019 patient was laughing, all of a sudden got lightheaded and passed out.  He has had had a concussion.  Those symptoms have resolved.  Patient was involved in the emergency room.   REVIEW OF SYSTEMS: Out of a complete 14 system review of symptoms, the patient complains only of the following symptoms, and all other reviewed systems are negative.  See HPI  ALLERGIES: Allergies  Allergen Reactions   Fentanyl Hives    HOME MEDICATIONS: Outpatient Medications Prior to Visit  Medication Sig Dispense Refill   albuterol (VENTOLIN HFA) 108 (90 Base) MCG/ACT inhaler Inhale 2 puffs into the lungs every 6 (six) hours as needed for wheezing or shortness of breath. 8 g 1   diphenhydrAMINE HCl (BENADRYL ALLERGY PO) Take by mouth daily. Take one tablet daily     OVER THE COUNTER MEDICATION Ghost energy drink 6 oz 4 x a week     OVER THE COUNTER MEDICATION [place] bitters 2 tablets daily     oxyCODONE-acetaminophen (PERCOCET) 7.5-325 MG tablet Take 1 tablet by mouth 2 (two) times daily as needed for severe pain. 12 tablet 0   SUMAtriptan 6 MG/0.5ML SOAJ Inject 6 mg into the skin daily as needed for up to 2 doses (cluster headache). 3 mL 6   Galcanezumab-gnlm (EMGALITY, 300 MG DOSE,) 100 MG/ML SOSY Inject 300 mg into the skin every 30 (thirty) days. (Patient not taking: Reported on 04/03/2022) 3.08 mL  2   zolpidem (AMBIEN) 10 MG tablet Take 1 tablet (10 mg total) by mouth at bedtime as needed for sleep. 30 tablet 0   Facility-Administered Medications Prior to Visit  Medication Dose Route Frequency Provider Last Rate Last Admin   0.9 %  sodium chloride infusion  500 mL Intravenous Once Armbruster, Carlota Raspberry, MD        PAST MEDICAL HISTORY: Past Medical History:  Diagnosis Date   Cluster headache    Concussion 01/2018   after fainting.   H/O skin graft    Paralyzed vocal cords 1995   resolved   Status post  pneumonectomy     PAST SURGICAL HISTORY: Past Surgical History:  Procedure Laterality Date   LARYNX SURGERY     with teflon reconstruction s/p prior trauma, work injury   total lung removal Left    1995    FAMILY HISTORY: Family History  Problem Relation Age of Onset   Colon polyps Mother    Colon cancer Mother    Cancer Mother        colon   Heart attack Mother    Heart disease Father    Hyperlipidemia Father    Hypertension Father    Prostate cancer Maternal Uncle    Cancer Maternal Grandmother    Crohn's disease Neg Hx    Esophageal cancer Neg Hx    Rectal cancer Neg Hx    Stomach cancer Neg Hx     SOCIAL HISTORY: Social History   Socioeconomic History   Marital status: Married    Spouse name: Audiological scientist   Number of children: 3   Years of education: college   Highest education level: Not on file  Occupational History    Employer: [employer]  Tobacco Use   Smoking status: Never    Passive exposure: Never   Smokeless tobacco: Never  Vaping Use   Vaping Use: Never used  Substance and Sexual Activity   Alcohol use: Yes    Comment: Socailly    Drug use: No   Sexual activity: Not on file  Other Topics Concern   Not on file  Social History Narrative   ** Merged History Encounter **       Patient is married [name] ), has 3 children   Patient is right handed   Education level is college   Caffeine consumption is 0   Social Determinants of Radio broadcast assistant Strain: Not on file  Food Insecurity: Not on file  Transportation Needs: Not on file  Physical Activity: Not on file  Stress: Not on file  Social Connections: Not on file  Intimate Partner Violence: Not on file   PHYSICAL EXAM  Vitals:   04/03/22 0801  BP: 131/81  Pulse: (!) 58  Weight: 170 lb (77.1 kg)  Height: '5\' 5"'$  (1.651 m)   Body mass index is 28.29 kg/m.  Generalized: Well developed, in no acute distress  Neurological examination  Mentation: Alert oriented to  time, place, history taking. Follows all commands speech and language fluent Cranial nerve II-XII: Pupils were equal round reactive to light. Extraocular movements were full, visual field were full on confrontational test. Facial sensation and strength were normal.  Head turning and shoulder shrug  were normal and symmetric. Motor: The motor testing reveals 5 over 5 strength of all 4 extremities. Good symmetric motor tone is noted throughout.  Sensory: Sensory testing is intact to soft touch on all 4 extremities. No evidence of extinction is noted.  Coordination: Cerebellar testing reveals good finger-nose-finger and heel-to-shin bilaterally.  Gait and station: Gait is normal. Tandem gait is normal.  Reflexes: Deep tendon reflexes are symmetric and normal bilaterally.   DIAGNOSTIC DATA (LABS, IMAGING, TESTING) - I reviewed patient records, labs, notes, testing and imaging myself where available.  Lab Results  Component Value Date   WBC 5.1 11/08/2021   HGB 16.0 11/08/2021   HCT 45.8 11/08/2021   MCV 90 11/08/2021   PLT 300 11/08/2021      Component Value Date/Time   NA 137 12/07/2021 0858   K 4.3 12/07/2021 0858   CL 99 12/07/2021 0858   CO2 23 12/07/2021 0858   GLUCOSE 87 12/07/2021 0858   GLUCOSE 99 01/21/2018 1704   BUN 11 12/07/2021 0858   CREATININE 1.38 (H) 12/07/2021 0858   CALCIUM 9.8 12/07/2021 0858   PROT 7.1 11/08/2021 1418   ALBUMIN 4.6 11/08/2021 1418   AST 26 11/08/2021 1418   ALT 29 11/08/2021 1418   ALKPHOS 46 11/08/2021 1418   BILITOT 0.8 11/08/2021 1418   GFRNONAA 68 01/01/2020 1411   GFRAA 79 01/01/2020 1411   Lab Results  Component Value Date   CHOL 161 11/08/2021   HDL 90 11/08/2021   LDLCALC 63 11/08/2021   TRIG 35 11/08/2021   CHOLHDL 1.8 11/08/2021   No results found for: "HGBA1C" No results found for: "VITAMINB12" Lab Results  Component Value Date   TSH 1.850 03/21/2021    Butler Denmark, AGNP-C, DNP 04/03/2022, 8:25 AM [facility] [address], [place], [place] [address] [phone]

## 2022-04-03 ENCOUNTER — Ambulatory Visit (INDEPENDENT_AMBULATORY_CARE_PROVIDER_SITE_OTHER): Payer: BC Managed Care – PPO | Admitting: Neurology

## 2022-04-03 ENCOUNTER — Encounter: Payer: Self-pay | Admitting: Neurology

## 2022-04-03 VITALS — BP 131/81 | HR 58 | Ht 65.0 in | Wt 170.0 lb

## 2022-04-03 DIAGNOSIS — G44029 Chronic cluster headache, not intractable: Secondary | ICD-10-CM

## 2022-04-03 MED ORDER — SUMATRIPTAN SUCCINATE 6 MG/0.5ML ~~LOC~~ SOAJ: 6.0000 mg | Freq: Every day | SUBCUTANEOUS | 6 refills | Status: DC | PRN

## 2022-04-03 MED ORDER — EMGALITY 120 MG/ML ~~LOC~~ SOAJ: 120.0000 mg | SUBCUTANEOUS | 11 refills | Status: DC

## 2022-04-03 NOTE — Patient Instructions (Signed)
Start Emgality for headache prevention, use the sample I am giving you for loading dose, it will be 2 injections, followed 30 days later by the 120 mg monthly dosing Give it 3-4 months to see full benefit  See you back in 4-6 months

## 2022-08-02 ENCOUNTER — Ambulatory Visit: Payer: BC Managed Care – PPO | Admitting: Internal Medicine

## 2022-08-15 ENCOUNTER — Telehealth: Payer: BC Managed Care – PPO | Admitting: Neurology

## 2022-08-15 NOTE — Progress Notes (Deleted)
Virtual Visit via Video Note  I connected with Richard Hughes on 08/15/22 at  2:15 PM EST by a video enabled telemedicine application and verified that I am speaking with the correct person using two identifiers.  Location: Patient: *** Provider: ***   I discussed the limitations of evaluation and management by telemedicine and the availability of in person appointments. The patient expressed understanding and agreed to proceed.  History of Present Illness:   04/03/22 SS: didn't start Emgality since last seen in June. Thinks was insurance PA issue, rx came from PCP. Headaches twice a month, can last 5 days. Mostly occipital area, lingers even when bad pain has resolved. With migraine features. He has old oxygen tank, he hasn't used. Migraines are intermittent. Takes Imitrex injection with good benefit within 5 minutes, but often returns. 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    UPDATE (12/26/21, VRP): Since last visit, doing well until Feb 2023. Now rx'd emgality (pending pickup) and sumatriptan injections (helping). Daily dull HA + 2 severe attacks per day.    PRIOR HPI (4025): 52 year old male here for evaluation of cluster headaches.  Patient has had these headaches since high school with left-sided, periorbital, throbbing severe pain associated with soreness over the scalp, lacrimation, scleral injection, photophobia, nausea, blurred vision and seeing sparkles.  Patient was patient diagnosed with cluster headaches and tried on verapamil in the past around 2015.  Also was tried on home oxygen which seemed to help.  Headaches resolved spontaneously and patient had no further follow-up in neurology.   Since that time headaches have returned in August 2019.  Now having headaches lasting 1 to 3 hours at a time.  Headaches can occur several times per day and in clusters lasting 3 to 6 weeks at a time.  In the past he has had periods of remission lasting up to 2 to 3 years  at a time.   In July 2019 patient was laughing, all of a sudden got lightheaded and passed out.  He has had had a concussion.  Those symptoms have resolved.  Patient was involved in the emergency room.   Observations/Objective:   Assessment and Plan:   Follow Up Instructions:    I discussed the assessment and treatment plan with the patient. The patient was provided an opportunity to ask questions and all were answered. The patient agreed with the plan and demonstrated an understanding of the instructions.   The patient was advised to call back or seek an in-person evaluation if the symptoms worsen or if the condition fails to improve as anticipated.  I provided *** minutes of non-face-to-face time during this encounter.   Suzzanne Cloud, NP

## 2022-11-16 ENCOUNTER — Encounter: Payer: Self-pay | Admitting: Medical

## 2022-11-16 ENCOUNTER — Ambulatory Visit (INDEPENDENT_AMBULATORY_CARE_PROVIDER_SITE_OTHER): Payer: BC Managed Care – PPO | Admitting: Medical

## 2022-11-16 VITALS — BP 112/70 | HR 55 | Ht 66.0 in | Wt 172.8 lb

## 2022-11-16 DIAGNOSIS — J45909 Unspecified asthma, uncomplicated: Secondary | ICD-10-CM | POA: Insufficient documentation

## 2022-11-16 DIAGNOSIS — Z125 Encounter for screening for malignant neoplasm of prostate: Secondary | ICD-10-CM

## 2022-11-16 DIAGNOSIS — G4700 Insomnia, unspecified: Secondary | ICD-10-CM

## 2022-11-16 DIAGNOSIS — Z13220 Encounter for screening for lipoid disorders: Secondary | ICD-10-CM | POA: Diagnosis not present

## 2022-11-16 DIAGNOSIS — Z9889 Other specified postprocedural states: Secondary | ICD-10-CM

## 2022-11-16 DIAGNOSIS — Z23 Encounter for immunization: Secondary | ICD-10-CM

## 2022-11-16 DIAGNOSIS — Z0000 Encounter for general adult medical examination without abnormal findings: Secondary | ICD-10-CM

## 2022-11-16 DIAGNOSIS — G44029 Chronic cluster headache, not intractable: Secondary | ICD-10-CM

## 2022-11-16 LAB — COMPREHENSIVE METABOLIC PANEL
Calcium: 10.4 mg/dL — ABNORMAL HIGH (ref 8.7–10.2)
Total Protein: 7.2 g/dL (ref 6.0–8.5)

## 2022-11-16 LAB — POCT URINALYSIS DIP (PROADVANTAGE DEVICE)
Bilirubin, UA: NEGATIVE
Blood, UA: NEGATIVE
Glucose, UA: NEGATIVE mg/dL
Ketones, POC UA: NEGATIVE mg/dL
Leukocytes, UA: NEGATIVE
Nitrite, UA: NEGATIVE
Protein Ur, POC: NEGATIVE mg/dL
Specific Gravity, Urine: 1.005
Urobilinogen, Ur: NEGATIVE
pH, UA: 6.5 (ref 5.0–8.0)

## 2022-11-16 LAB — HIV ANTIBODY (ROUTINE TESTING W REFLEX)

## 2022-11-16 LAB — CBC
Hemoglobin: 15.5 g/dL (ref 13.0–17.7)
MCV: 93 fL (ref 79–97)
Platelets: 284 10*3/uL (ref 150–450)
WBC: 6.1 10*3/uL (ref 3.4–10.8)

## 2022-11-16 NOTE — Patient Instructions (Signed)
This visit was a preventative care visit, also known as wellness visit or routine physical.   Topics typically include healthy lifestyle, diet, exercise, preventative care, vaccinations, sick and well care, proper use of emergency dept and after hours care, as well as other concerns.     Recommendations: Continue to return yearly for your annual wellness and preventative care visits.  This gives [place] a chance to discuss healthy lifestyle, exercise, vaccinations, review your chart record, and perform screenings where appropriate.  I recommend you see your eye doctor yearly for routine vision care.  I recommend you see your dentist yearly for routine dental care including hygiene visits twice yearly.   Vaccination recommendations were reviewed Immunization History  Administered Date(s) Administered   Td 11/08/2021   Counseled on pneumonia, flu and shingrix vaccines  Counseled on the pneumococcal vaccine.  Vaccine information sheet given.  Pneumococcal vaccine PPSV23 given after consent obtained.   Screening for cancer: Colon cancer screening: 02/2022 colonoscopy reviewed, multiple polyps, repeat 3 years  We discussed PSA, prostate exam, and prostate cancer screening risks/benefits.     Skin cancer screening: Check your skin regularly for new changes, growing lesions, or other lesions of concern Come in for evaluation if you have skin lesions of concern.  Lung cancer screening: If you have a greater than 20 pack year history of tobacco use, then you may qualify for lung cancer screening with a chest CT scan.   Please call your insurance company to inquire about coverage for this test.  We currently don't have screenings for other cancers besides breast, cervical, colon, and lung cancers.  If you have a strong family history of cancer or have other cancer screening concerns, please let me know.    Bone health: Get at least 150 minutes of aerobic exercise weekly Get weight bearing  exercise at least once weekly Bone density test:  A bone density test is an imaging test that uses a type of X-ray to measure the amount of calcium and other minerals in your bones. The test may be used to diagnose or screen you for a condition that causes weak or thin bones (osteoporosis), predict your risk for a broken bone (fracture), or determine how well your osteoporosis treatment is working. The bone density test is recommended for females 65 and older, or females or males <65 if certain risk factors such as thyroid disease, long term use of steroids such as for asthma or rheumatological issues, vitamin D deficiency, estrogen deficiency, family history of osteoporosis, self or family history of fragility fracture in first degree relative.    Heart health: Get at least 150 minutes of aerobic exercise weekly Limit alcohol It is important to maintain a healthy blood pressure and healthy cholesterol numbers  Heart disease screening: Screening for heart disease includes screening for blood pressure, fasting lipids, glucose/diabetes screening, BMI height to weight ratio, reviewed of smoking status, physical activity, and diet.    Goals include blood pressure 120/80 or less, maintaining a healthy lipid/cholesterol profile, preventing diabetes or keeping diabetes numbers under good control, not smoking or using tobacco products, exercising most days per week or at least 150 minutes per week of exercise, and eating healthy variety of fruits and vegetables, healthy oils, and avoiding unhealthy food choices like fried food, fast food, high sugar and high cholesterol foods.    Other tests may possibly include EKG test, CT coronary calcium score, echocardiogram, exercise treadmill stress test.   Echocardioram 05/16/2021: EF 60-65%, normal LV function, small  pericardial effusion present   CT coronary 05/18/21 IMPRESSION: 1. Minimal CAD, CADRADS = 1.  2. Coronary calcium score is 2, which places the  patient in the 86th percentile for age and sex matched control.  3. Normal coronary origins with left dominance.   Medical care options: I recommend you continue to seek care here first for routine care.  We try really hard to have available appointments Monday through Friday daytime hours for sick visits, acute visits, and physicals.  Urgent care should be used for after hours and weekends for significant issues that cannot wait till the next day.  The emergency department should be used for significant potentially life-threatening emergencies.  The emergency department is expensive, can often have long wait times for less significant concerns, so try to utilize primary care, urgent care, or telemedicine when possible to avoid unnecessary trips to the emergency department.  Virtual visits and telemedicine have been introduced since the pandemic started in 2020, and can be convenient ways to receive medical care.  We offer virtual appointments as well to assist you in a variety of options to seek medical care.    Separate significant issues discussed: Cluster headaches -no recent problems, continue abortive therapy as needed  Reactive airway disease-no recent issues, use albuterol as needed  Very minimal coronary artery disease on CT coronary screening in 2022-counseled on low-cholesterol diet and he is willing to use low dose statin pending labs

## 2022-11-16 NOTE — Progress Notes (Signed)
Subjective:   HPI  Richard Hughes is a 52 y.o. male who presents for Chief Complaint  Patient presents with   fasting cpe    Fasting cpe, no concerns    Patient Care Team: Shallon Yaklin, Kermit Balo, PA-C as PCP - General (Family Medicine) Parke Poisson, MD as PCP - Cardiology (Cardiology) Sees dentist Sees eye doctor Dr. Joycelyn Schmid and Margie Ege, NP,  neurology Dr. Ileene Patrick, GI   Concerns: Cluster headaches - no recent issues  Hx/o lung surgery, reactive airway  - no recent issues    Reviewed their medical, surgical, family, social, medication, and allergy history and updated chart as appropriate.  Past Medical History:  Diagnosis Date   Cluster headache    Concussion 01/2018   after fainting.   H/O skin graft    Paralyzed vocal cords 1995   resolved   Status post pneumonectomy     Past Surgical History:  Procedure Laterality Date   LARYNX SURGERY     with teflon reconstruction s/p prior trauma, work injury   total lung removal Left    1995    Family History  Problem Relation Age of Onset   Colon polyps Mother    Colon cancer Mother    Cancer Mother        colon   Heart attack Mother    Heart disease Father    Hyperlipidemia Father    Hypertension Father    Prostate cancer Maternal Uncle    Cancer Maternal Grandmother    Crohn's disease Neg Hx    Esophageal cancer Neg Hx    Rectal cancer Neg Hx    Stomach cancer Neg Hx      Current Outpatient Medications:    albuterol (VENTOLIN HFA) 108 (90 Base) MCG/ACT inhaler, Inhale 2 puffs into the lungs every 6 (six) hours as needed for wheezing or shortness of breath., Disp: 8 g, Rfl: 1   SUMAtriptan 6 MG/0.5ML SOAJ, Inject 6 mg into the skin daily as needed for up to 2 doses (cluster headache)., Disp: 3 mL, Rfl: 6  Allergies  Allergen Reactions   Fentanyl Hives    Review of Systems  Constitutional:  Negative for chills, fever, malaise/fatigue and weight loss.  HENT:  Negative for  congestion, ear pain, hearing loss, sore throat and tinnitus.   Eyes:  Negative for blurred vision, pain and redness.  Respiratory:  Negative for cough, hemoptysis and shortness of breath.   Cardiovascular:  Negative for chest pain, palpitations, orthopnea, claudication and leg swelling.  Gastrointestinal:  Negative for abdominal pain, blood in stool, constipation, diarrhea, nausea and vomiting.  Genitourinary:  Negative for dysuria, flank pain, frequency, hematuria and urgency.  Musculoskeletal:  Negative for falls, joint pain and myalgias.  Skin:  Negative for itching and rash.  Neurological:  Negative for dizziness, tingling, speech change, weakness and headaches.  Endo/Heme/Allergies:  Negative for polydipsia. Does not bruise/bleed easily.  Psychiatric/Behavioral:  Negative for depression and memory loss. The patient is not nervous/anxious and does not have insomnia.         11/16/2022    8:16 AM 11/08/2021    1:40 PM 03/13/2021    2:43 PM 01/29/2018    4:00 PM  Depression screen PHQ 2/9  Decreased Interest 0 0 0 0  Down, Depressed, Hopeless 0 0 0 0  PHQ - 2 Score 0 0 0 0        Objective:  BP 112/70   Pulse (!) 55   Ht  5\' 6"  (1.676 m)   Wt 172 lb 12.8 oz (78.4 kg)   BMI 27.89 kg/m   Wt Readings from Last 3 Encounters:  11/16/22 172 lb 12.8 oz (78.4 kg)  04/03/22 170 lb (77.1 kg)  03/06/22 166 lb 6.4 oz (75.5 kg)    General appearance: alert, no distress, WD/WN, African American male Skin: tattoo of name right neck, long horizontal surgical scar left upper chest anterior to posterior, large skin graft on the left upper back, otherwise no worrisome lesions HEENT: normocephalic, conjunctiva/corneas normal, sclerae anicteric, PERRLA, EOMi, nares patent, no discharge or erythema, pharynx normal Oral cavity: MMM, tongue normal, teeth normal Neck: supple, no lymphadenopathy, no thyromegaly, no masses, normal ROM, no bruits Chest: non tender, normal shape and expansion Heart:  RRR, normal S1, S2, no murmurs Lungs: CTA bilaterally, no wheezes, rhonchi, or rales Abdomen: +bs, soft, non tender, non distended, no masses, no hepatomegaly, no splenomegaly, no bruits Back: non tender, normal ROM, no scoliosis Musculoskeletal: upper extremities non tender, no obvious deformity, normal ROM throughout, lower extremities non tender, no obvious deformity, normal ROM throughout Extremities: no edema, no cyanosis, no clubbing Pulses: 2+ symmetric, upper and lower extremities, normal cap refill Neurological: alert, oriented x 3, CN2-12 intact, strength normal upper extremities and lower extremities, sensation normal throughout, DTRs 2+ throughout, no cerebellar signs, gait normal Psychiatric: normal affect, behavior normal, pleasant  GU: normal male external genitalia,circumcised, nontender, no masses, no hernia, no lymphadenopathy Rectal: Anus normal tone, prostate within normal limits    Assessment and Plan :   Encounter Diagnoses  Name Primary?   Encounter for health maintenance examination in adult Yes   Screening for prostate cancer    Screening for lipid disorders    Insomnia, unspecified type    Chronic cluster headache, not intractable    Need for pneumococcal vaccination    Mild reactive airways disease, unspecified whether persistent    History of lung surgery     This visit was a preventative care visit, also known as wellness visit or routine physical.   Topics typically include healthy lifestyle, diet, exercise, preventative care, vaccinations, sick and well care, proper use of emergency dept and after hours care, as well as other concerns.     Recommendations: Continue to return yearly for your annual wellness and preventative care visits.  This gives [place] a chance to discuss healthy lifestyle, exercise, vaccinations, review your chart record, and perform screenings where appropriate.  I recommend you see your eye doctor yearly for routine vision care.  I  recommend you see your dentist yearly for routine dental care including hygiene visits twice yearly.   Vaccination recommendations were reviewed Immunization History  Administered Date(s) Administered   Pneumococcal Polysaccharide-23 11/16/2022   Td 11/08/2021   Counseled on pneumonia, flu and shingrix vaccines  Counseled on the pneumococcal vaccine.  Vaccine information sheet given.  Pneumococcal vaccine PPSV23 given after consent obtained.   Screening for cancer: Colon cancer screening: 02/2022 colonoscopy reviewed, multiple polyps, repeat 3 years  We discussed PSA, prostate exam, and prostate cancer screening risks/benefits.     Skin cancer screening: Check your skin regularly for new changes, growing lesions, or other lesions of concern Come in for evaluation if you have skin lesions of concern.  Lung cancer screening: If you have a greater than 20 pack year history of tobacco use, then you may qualify for lung cancer screening with a chest CT scan.   Please call your insurance company to inquire about coverage  for this test.  We currently don't have screenings for other cancers besides breast, cervical, colon, and lung cancers.  If you have a strong family history of cancer or have other cancer screening concerns, please let me know.    Bone health: Get at least 150 minutes of aerobic exercise weekly Get weight bearing exercise at least once weekly Bone density test:  A bone density test is an imaging test that uses a type of X-ray to measure the amount of calcium and other minerals in your bones. The test may be used to diagnose or screen you for a condition that causes weak or thin bones (osteoporosis), predict your risk for a broken bone (fracture), or determine how well your osteoporosis treatment is working. The bone density test is recommended for females 65 and older, or females or males <65 if certain risk factors such as thyroid disease, long term use of steroids such  as for asthma or rheumatological issues, vitamin D deficiency, estrogen deficiency, family history of osteoporosis, self or family history of fragility fracture in first degree relative.    Heart health: Get at least 150 minutes of aerobic exercise weekly Limit alcohol It is important to maintain a healthy blood pressure and healthy cholesterol numbers  Heart disease screening: Screening for heart disease includes screening for blood pressure, fasting lipids, glucose/diabetes screening, BMI height to weight ratio, reviewed of smoking status, physical activity, and diet.    Goals include blood pressure 120/80 or less, maintaining a healthy lipid/cholesterol profile, preventing diabetes or keeping diabetes numbers under good control, not smoking or using tobacco products, exercising most days per week or at least 150 minutes per week of exercise, and eating healthy variety of fruits and vegetables, healthy oils, and avoiding unhealthy food choices like fried food, fast food, high sugar and high cholesterol foods.    Other tests may possibly include EKG test, CT coronary calcium score, echocardiogram, exercise treadmill stress test.   Echocardioram 05/16/2021: EF 60-65%, normal LV function, small pericardial effusion present   CT coronary 05/18/21 IMPRESSION: 1. Minimal CAD, CADRADS = 1.  2. Coronary calcium score is 2, which places the patient in the 86th percentile for age and sex matched control.  3. Normal coronary origins with left dominance.   Medical care options: I recommend you continue to seek care here first for routine care.  We try really hard to have available appointments Monday through Friday daytime hours for sick visits, acute visits, and physicals.  Urgent care should be used for after hours and weekends for significant issues that cannot wait till the next day.  The emergency department should be used for significant potentially life-threatening emergencies.  The emergency  department is expensive, can often have long wait times for less significant concerns, so try to utilize primary care, urgent care, or telemedicine when possible to avoid unnecessary trips to the emergency department.  Virtual visits and telemedicine have been introduced since the pandemic started in 2020, and can be convenient ways to receive medical care.  We offer virtual appointments as well to assist you in a variety of options to seek medical care.    Separate significant issues discussed: Cluster headaches -no recent problems, continue abortive therapy as needed  Reactive airway disease-no recent issues, use albuterol as needed  Very minimal coronary artery disease on CT coronary screening in 2022-counseled on low-cholesterol diet and he is willing to use low dose statin pending labs   Shirl was seen today for fasting cpe.  Diagnoses and all orders for this visit:  Encounter for health maintenance examination in adult -     Comprehensive metabolic panel -     CBC -     Lipid panel -     POCT Urinalysis DIP (Proadvantage Device) -     HIV Antibody (routine testing w rflx) -     Hepatitis C antibody -     PSA  Screening for prostate cancer -     PSA  Screening for lipid disorders -     Lipid panel  Insomnia, unspecified type  Chronic cluster headache, not intractable  Need for pneumococcal vaccination -     Pneumococcal polysaccharide vaccine 23-valent greater than or equal to 2yo subcutaneous/IM  Mild reactive airways disease, unspecified whether persistent  History of lung surgery    Follow-up pending labs, yearly for physical

## 2022-11-17 LAB — COMPREHENSIVE METABOLIC PANEL
AST: 25 IU/L (ref 0–40)
Albumin/Globulin Ratio: 2.1 (ref 1.2–2.2)
Albumin: 4.9 g/dL (ref 4.1–5.1)
Alkaline Phosphatase: 52 IU/L (ref 44–121)
BUN/Creatinine Ratio: 17 (ref 9–20)
BUN: 19 mg/dL (ref 6–24)
Bilirubin Total: 0.7 mg/dL (ref 0.0–1.2)
CO2: 25 mmol/L (ref 20–29)
Globulin, Total: 2.3 g/dL (ref 1.5–4.5)
Glucose: 78 mg/dL (ref 70–99)
eGFR: 80 mL/min/{1.73_m2} (ref >59)

## 2022-11-17 LAB — CBC
Hematocrit: 45.8 % (ref 37.5–51.0)
MCH: 31.4 pg (ref 26.6–33.0)
MCHC: 33.8 g/dL (ref 31.5–35.7)
RBC: 4.94 x10E6/uL (ref 4.14–5.80)
RDW: 12.1 % (ref 11.6–15.4)

## 2022-11-17 LAB — PSA: Prostate Specific Ag, Serum: 0.6 ng/mL (ref 0.0–4.0)

## 2022-11-17 LAB — LIPID PANEL
Chol/HDL Ratio: 1.7 ratio (ref 0.0–5.0)
Cholesterol, Total: 176 mg/dL (ref 100–199)
HDL: 104 mg/dL (ref >39)
LDL Chol Calc (NIH): 63 mg/dL (ref 0–99)
Triglycerides: 42 mg/dL (ref 0–149)
VLDL Cholesterol Cal: 9 mg/dL (ref 5–40)

## 2022-11-17 LAB — HEPATITIS C ANTIBODY: Hep C Virus Ab: NONREACTIVE

## 2022-11-19 ENCOUNTER — Other Ambulatory Visit: Payer: Self-pay | Admitting: Medical

## 2022-11-19 MED ORDER — ROSUVASTATIN CALCIUM 5 MG PO TABS: 5.0000 mg | ORAL_TABLET | ORAL | 2 refills | Status: AC

## 2022-11-19 MED ORDER — SUMATRIPTAN SUCCINATE 6 MG/0.5ML ~~LOC~~ SOAJ: 6.0000 mg | Freq: Every day | SUBCUTANEOUS | 2 refills | Status: DC | PRN

## 2022-11-19 NOTE — Progress Notes (Signed)
Results sent through MyChart

## 2023-09-16 IMAGING — CT CT HEART MORP W/ CTA COR W/ SCORE W/ CA W/CM &/OR W/O CM
4 of 8 series · 8 of 20 positions shown, 9 images · IV contrast (APPLIED)
Comparison: No priors.
COMPARISON: No priors.

Addendum:
EXAM:
OVER-READ INTERPRETATION  CT CHEST

The following report is an over-read performed by radiologist Dr.
Arissa Billiot [REDACTED] on 05/18/2021. This
over-read does not include interpretation of cardiac or coronary
anatomy or pathology. The coronary calcium score/coronary CTA
interpretation by the cardiologist is attached.
HISTORY: Chest pain, nonspecific Dyspnea on exertion (JADA)
Cardiac/Coronary  CT
TECHNIQUE: The patient was scanned on a Siemens Force scanner.
PROTOCOL: A 120 kV prospective scan was triggered in the descending thoracic
aorta at 111 HU's. Axial non-contrast 3 mm slices were carried out
through the heart. The data set was analyzed on a dedicated work
station and scored using the Agatston method. Gantry rotation speed
was 250 msecs and collimation was .6 mm. Beta blockade and 0.8 mg of
sl NTG was given. The 3D data set was reconstructed in 5% intervals
of the 35-75 % of the R-R cycle. Systolic and diastolic phases were
analyzed on a dedicated work station using MPR, MIP and VRT modes.
The patient received 95mL OMNIPAQUE IOHEXOL 350 MG/ML SOLN contrast.

[Series 6: best diast 71 % · axial · 0.37mm/px · z∈[+1115,+1155]mm · 2 of 301 slices shown, 3 images]
[im 101/301  vessel]
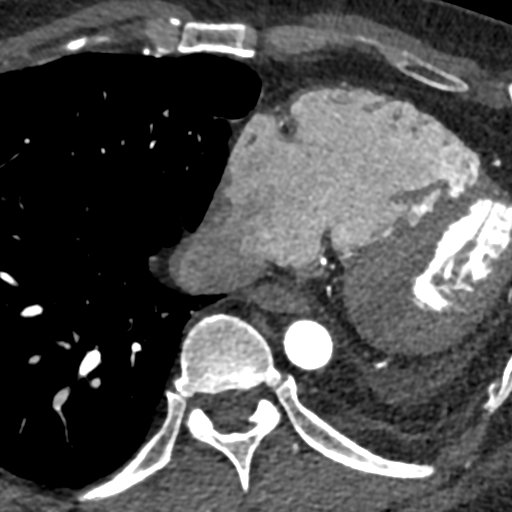
[im 101/301  lung]
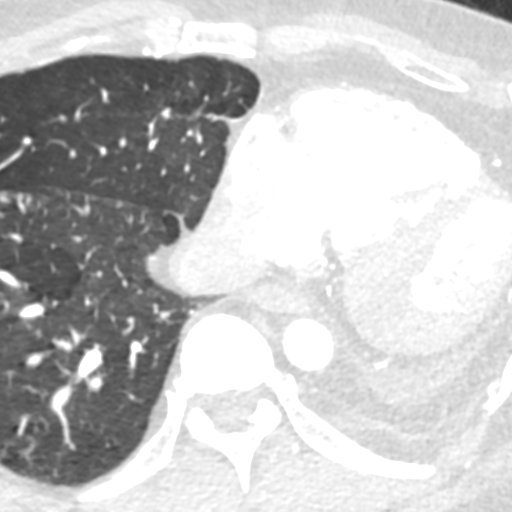
[im 201/301  vessel]
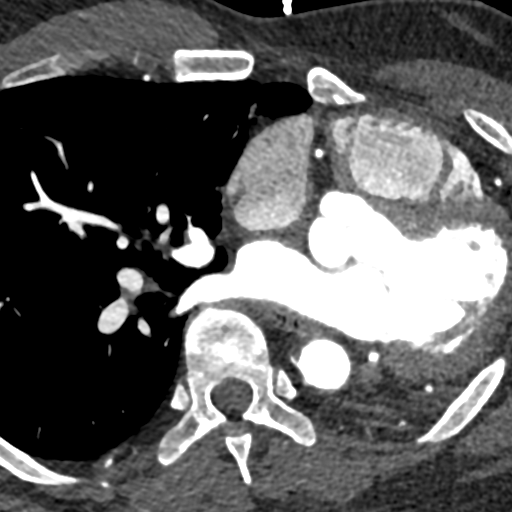

[Series 7: best syst 34 % · axial · 0.41mm/px · z∈[+1115,+1155]mm · 2 of 301 slices shown]
[im 101/301  vessel]
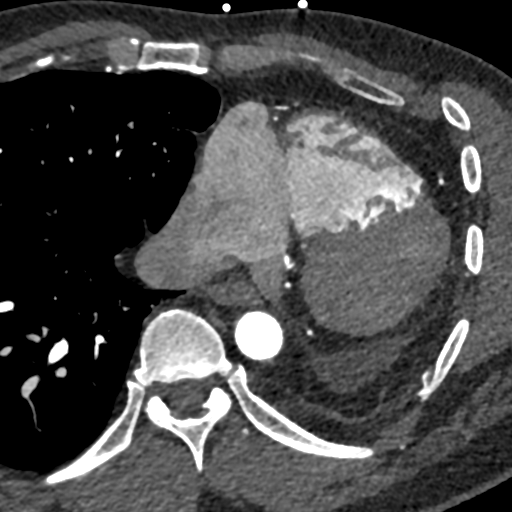
[im 201/301  vessel]
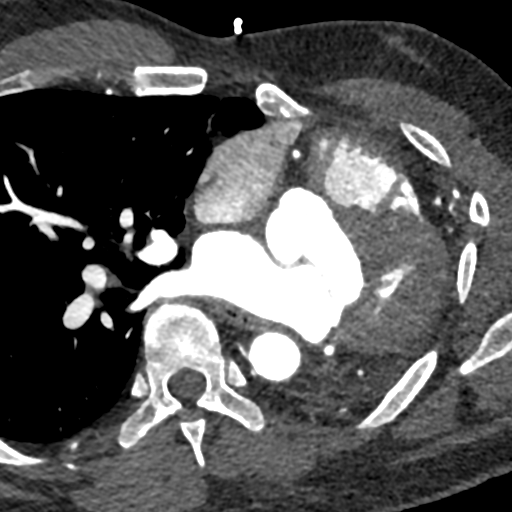

[Series 9: ts diast sharp 70 % · axial · 0.41mm/px · z∈[+1115,+1155]mm · 2 of 301 slices shown]
[im 101/301  lung]
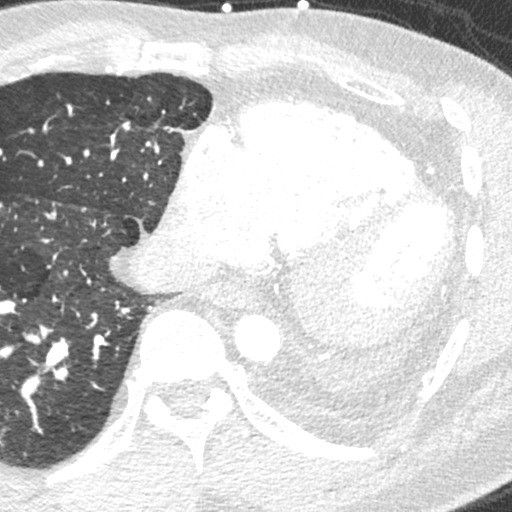
[im 201/301  lung]
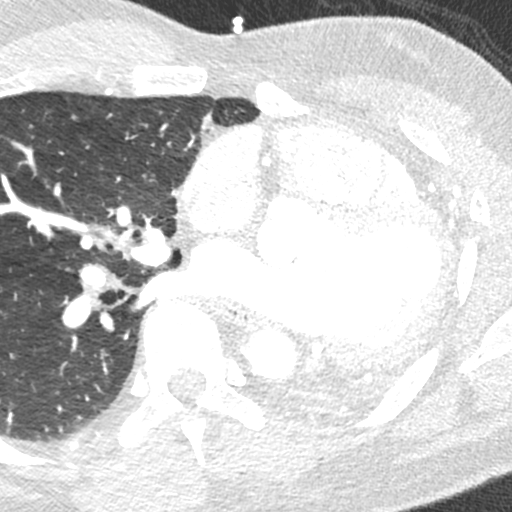

[Series 10: ts syst sharp 34 % · axial · 0.41mm/px · z∈[+1115,+1155]mm · 2 of 301 slices shown]
[im 101/301  lung]
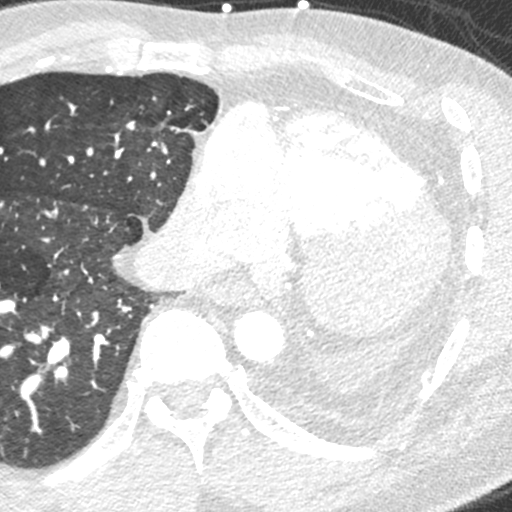
[im 201/301  lung]
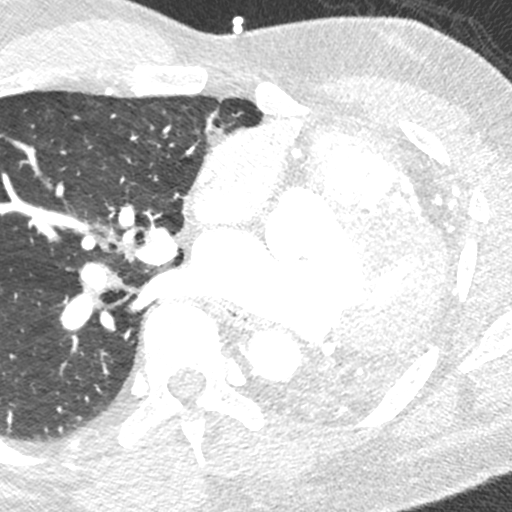

[8 of 20 positions shown; findings below may reference images not displayed]

FINDINGS: Status post left pneumonectomy with shift of cardiomediastinal
structures into the left hemithorax. Trace amount of fluid in the
left hemithorax. No right pleural effusion. Within the visualized
portions of the thorax there are no suspicious appearing pulmonary
nodules or masses, there is no acute consolidative airspace disease,
no pneumothorax and no lymphadenopathy. Visualized portions of the
upper abdomen are unremarkable. Postthoracotomy changes in the left
ribs. There are no aggressive appearing lytic or blastic lesions
noted in the visualized portions of the skeleton.
IMPRESSION: 1. Status post left pneumonectomy. No other significant incidental
noncardiac findings are noted.
FINDINGS: Image quality: Good

Noise artifact is: Limited

Coronary calcium score is 2, which places the patient in the 86th
percentile for age and sex matched control.

Coronary arteries: Normal coronary origins.  Left dominance.

Right Coronary Artery: No detectable plaque or stenosis.

Left Main Coronary Artery: No detectable plaque or stenosis.

Left Anterior Descending Coronary Artery: Minimal mixed
atherosclerotic plaque at the ostial LAD, <25% stenosis.

Left Circumflex Artery: No detectable plaque or stenosis.

Aorta: Normal size, 25 mm at the mid ascending aorta (level of the
PA bifurcation) measured double oblique.

Aortic Valve: No calcifications.  Tricuspid aortic valve.

Other findings:

S/p Left pneumonectomy, described in radiologist's report. Resultant
leftward cardiac shift.

Left sided pulmonary veins appear ligated, expected post operative
change.

Normal left atrial appendage without thrombus.

Normal size of the pulmonary artery.
IMPRESSION: 1. Minimal CAD, CADRADS = 1.

2. Coronary calcium score is 2, which places the patient in the 86th
percentile for age and sex matched control.

3. Normal coronary origins with left dominance.

*** End of Addendum ***
EXAM:
OVER-READ INTERPRETATION  CT CHEST

The following report is an over-read performed by radiologist Dr.
Arissa Billiot [REDACTED] on 05/18/2021. This
over-read does not include interpretation of cardiac or coronary
anatomy or pathology. The coronary calcium score/coronary CTA
interpretation by the cardiologist is attached.
FINDINGS: Status post left pneumonectomy with shift of cardiomediastinal
structures into the left hemithorax. Trace amount of fluid in the
left hemithorax. No right pleural effusion. Within the visualized
portions of the thorax there are no suspicious appearing pulmonary
nodules or masses, there is no acute consolidative airspace disease,
no pneumothorax and no lymphadenopathy. Visualized portions of the
upper abdomen are unremarkable. Postthoracotomy changes in the left
ribs. There are no aggressive appearing lytic or blastic lesions
noted in the visualized portions of the skeleton.
IMPRESSION: 1. Status post left pneumonectomy. No other significant incidental
noncardiac findings are noted.

## 2023-11-13 ENCOUNTER — Other Ambulatory Visit: Payer: Self-pay | Admitting: Medical

## 2023-11-13 NOTE — Telephone Encounter (Signed)
 Copied from CRM [id number]. Topic: Clinical - Medication Refill >> Nov 13, 2023 11:27 AM Arminda Landmark wrote: Most Recent Primary Care Visit:  Provider: Claudene Crystal  Department: [facility]  Visit Type: PHYSICAL 45  Date: 11/16/2022  Medication: SUMAtriptan  6 MG/0.5ML SOAJ  Has the patient contacted their pharmacy? No (Agent: If no, request that the patient contact the pharmacy for the refill. If patient does not wish to contact the pharmacy document the reason why and proceed with request.) (Agent: If yes, when and what did the pharmacy advise?)  Is this the correct pharmacy for this prescription? Yes If no, delete pharmacy and type the correct one.  This is the patient's preferred pharmacy:  [id number] - [place], [place] - [address] AT [facility] [address] [place] [address] Phone: [phone] Fax: [phone]   Has the prescription been filled recently? Yes  Is the patient out of the medication? Yes  Has the patient been seen for an appointment in the last year OR does the patient have an upcoming appointment? Yes  Can we respond through MyChart? Yes  Agent: Please be advised that Rx refills may take up to 3 business days. We ask that you follow-up with your pharmacy.

## 2023-11-14 MED ORDER — SUMATRIPTAN SUCCINATE 6 MG/0.5ML ~~LOC~~ SOAJ
6.0000 mg | Freq: Every day | SUBCUTANEOUS | 2 refills | Status: AC | PRN
Start: 2023-11-14 — End: ?

## 2023-11-22 ENCOUNTER — Encounter: Payer: BC Managed Care – PPO | Admitting: Medical
